# Patient Record
Sex: Male | Born: 1960 | Race: White | Hispanic: No | Marital: Married
Health system: Midwestern US, Community
[De-identification: ages and names within clinical notes are randomized; demographics above are authoritative.]

## PROBLEM LIST (undated history)

## (undated) DIAGNOSIS — M4802 Spinal stenosis, cervical region: Secondary | ICD-10-CM

## (undated) DIAGNOSIS — C4491 Basal cell carcinoma of skin, unspecified: Secondary | ICD-10-CM

## (undated) DIAGNOSIS — M25512 Pain in left shoulder: Secondary | ICD-10-CM

## (undated) DIAGNOSIS — I10 Essential (primary) hypertension: Secondary | ICD-10-CM

## (undated) DIAGNOSIS — M25511 Pain in right shoulder: Secondary | ICD-10-CM

## (undated) HISTORY — DX: Pain in left shoulder: M25.512

## (undated) HISTORY — PX: COLONOSCOPY: SHX174

## (undated) HISTORY — DX: Basal cell carcinoma of skin, unspecified: C44.91

## (undated) HISTORY — PX: BASAL CELL CARCINOMA EXCISION: SHX1214

## (undated) HISTORY — DX: Pain in right shoulder: M25.511

---

## 2006-02-27 ENCOUNTER — Ambulatory Visit: Payer: Self-pay | Admitting: Internal Medicine

## 2007-07-23 ENCOUNTER — Ambulatory Visit: Payer: Self-pay | Admitting: Internal Medicine

## 2007-07-23 LAB — CONVERTED CEMR LAB
ALT: 27 units/L (ref 0–53)
Albumin: 4 g/dL (ref 3.5–5.2)
Alkaline Phosphatase: 93 units/L (ref 39–117)
BUN: 11 mg/dL (ref 6–23)
Basophils Absolute: 0 10*3/uL (ref 0.0–0.1)
Bilirubin, Direct: 0.2 mg/dL (ref 0.0–0.3)
Blood in Urine, dipstick: NEGATIVE
Calcium: 9.5 mg/dL (ref 8.4–10.5)
Eosinophils Absolute: 0.1 10*3/uL (ref 0.0–0.6)
GFR calc Af Amer: 93 mL/min
GFR calc non Af Amer: 77 mL/min
Glucose, Bld: 93 mg/dL (ref 70–99)
Glucose, Urine, Semiquant: NEGATIVE
HDL: 40.7 mg/dL (ref >39.0)
Ketones, urine, test strip: NEGATIVE
Lymphocytes Relative: 32.6 % (ref 12.0–46.0)
MCHC: 33.4 g/dL (ref 30.0–36.0)
MCV: 85.4 fL (ref 78.0–100.0)
Monocytes Relative: 10.1 % (ref 3.0–11.0)
Neutro Abs: 4.8 10*3/uL (ref 1.4–7.7)
Platelets: 266 10*3/uL (ref 150–400)
Protein, U semiquant: NEGATIVE
Triglycerides: 55 mg/dL (ref 0–149)

## 2007-07-30 ENCOUNTER — Ambulatory Visit: Payer: Self-pay | Admitting: Internal Medicine

## 2007-07-31 ENCOUNTER — Encounter: Payer: Self-pay | Admitting: Internal Medicine

## 2009-05-05 ENCOUNTER — Encounter: Payer: Self-pay | Admitting: Internal Medicine

## 2009-06-29 ENCOUNTER — Ambulatory Visit: Payer: Self-pay | Admitting: Internal Medicine

## 2009-06-29 LAB — CONVERTED CEMR LAB
BUN: 10 mg/dL (ref 6–23)
Bilirubin Urine: NEGATIVE
Bilirubin, Direct: 0.2 mg/dL (ref 0.0–0.3)
CO2: 29 meq/L (ref 19–32)
Chloride: 103 meq/L (ref 96–112)
Cholesterol: 111 mg/dL (ref 0–200)
Creatinine, Ser: 1.1 mg/dL (ref 0.4–1.5)
Eosinophils Absolute: 0.1 10*3/uL (ref 0.0–0.7)
Ketones, urine, test strip: NEGATIVE
LDL Cholesterol: 53 mg/dL (ref 0–99)
MCHC: 32.9 g/dL (ref 30.0–36.0)
MCV: 89.1 fL (ref 78.0–100.0)
Monocytes Absolute: 0.7 10*3/uL (ref 0.1–1.0)
Neutrophils Relative %: 55 % (ref 43.0–77.0)
Platelets: 197 10*3/uL (ref 150.0–400.0)
RDW: 12.6 % (ref 11.5–14.6)
Total Bilirubin: 1.9 mg/dL — ABNORMAL HIGH (ref 0.3–1.2)
Triglycerides: 96 mg/dL (ref 0.0–149.0)
Urobilinogen, UA: 0.2
WBC: 7 10*3/uL (ref 4.5–10.5)

## 2009-07-20 ENCOUNTER — Ambulatory Visit: Payer: Self-pay | Admitting: Internal Medicine

## 2010-07-08 NOTE — Assessment & Plan Note (Signed)
Summary: CPX // RS/PT RSC/CJR/PT RSC/CJR/PT [facility] FROM BMP/CJR   Vital Signs:  Patient profile:   50 year old male Height:      71.5 inches Weight:      247 pounds BMI:     34.09 Pulse rate:   60 / minute Resp:     12 per minute BP sitting:   124 / 86  (left arm)  Vitals Entered By: Gladis Riffle, RN (July 20, 2009 8:13 AM) CC: cpx, labs done Is Patient Diabetic? No   CC:  cpx and labs done.  Preventive Screening-Counseling & Management  Alcohol-Tobacco     Alcohol drinks/day: <1     Smoking Status: never  Medications Prior to Update: 1)  No Medications  Allergies (verified): No Known Drug Allergies   Complete Medication List: 1)  No Medications  Physical Exam General Appearance: well developed, well nourished, no acute distress Eyes: conjunctiva and lids normal, PERRL, EOMI Ears, Nose, Mouth, Throat: TM clear, nares clear, oral exam WNL Neck: supple, no lymphadenopathy, no thyromegaly, no JVD Respiratory: clear to auscultation and percussion, respiratory effort normal Cardiovascular: regular rate and rhythm, S1-S2, no murmur, rub or gallop, no bruits, peripheral pulses normal and symmetric, no cyanosis, clubbing, edema or varicosities Chest: no scars, masses, tenderness; no asymmetry, skin changes, nipple discharge, no gynecomastia   Gastrointestinal: soft, non-tender; no hepatosplenomegaly, masses; active bowel sounds all quadrants,  no masses, tenderness, hemorrhoids  Genitourinary: no hernia,  or prostate enlargement Lymphatic: no cervical, axillary or inguinal adenopathy Musculoskeletal: gait normal, muscle tone and strength WNL, no joint swelling, effusions, discoloration, crepitus  Skin: clear, good turgor, color WNL, no rashes, lesions, or ulcerations Neurologic: normal mental status, normal reflexes, normal strength, sensation, and motion Psychiatric: alert; oriented to person, place and time Other Exam:     Appended Document: CPX // RS/PT RSC/CJR/PT  RSC/CJR/PT [facility] FROM BMP/CJR     Allergies: No Known Drug Allergies   Impression & Recommendations:  Problem # 1:  PHYSICAL EXAMINATION (ICD-V70.0) health maint utd discussed need for daily exercise, low calorie diet and aggressive weight loss.   Complete Medication List: 1)  No Medications

## 2010-08-09 ENCOUNTER — Other Ambulatory Visit: Payer: BC Managed Care – PPO | Admitting: Internal Medicine

## 2010-08-09 DIAGNOSIS — Z0000 Encounter for general adult medical examination without abnormal findings: Secondary | ICD-10-CM

## 2010-08-09 LAB — CBC WITH DIFFERENTIAL/PLATELET
Basophils Absolute: 0 10*3/uL (ref 0.0–0.1)
Basophils Relative: 0.4 % (ref 0.0–3.0)
HCT: 45.8 % (ref 39.0–52.0)
Hemoglobin: 15.7 g/dL (ref 13.0–17.0)
Lymphs Abs: 2.7 10*3/uL (ref 0.7–4.0)
MCHC: 34.2 g/dL (ref 30.0–36.0)
Monocytes Relative: 10.9 % (ref 3.0–12.0)
Neutro Abs: 4.5 10*3/uL (ref 1.4–7.7)
RBC: 5.22 Mil/uL (ref 4.22–5.81)
RDW: 13.4 % (ref 11.5–14.6)

## 2010-08-09 LAB — HEPATIC FUNCTION PANEL
Albumin: 3.8 g/dL (ref 3.5–5.2)
Alkaline Phosphatase: 91 U/L (ref 39–117)
Total Protein: 6.7 g/dL (ref 6.0–8.3)

## 2010-08-09 LAB — POCT URINALYSIS DIPSTICK
Ketones, UA: NEGATIVE
Leukocytes, UA: NEGATIVE
Nitrite, UA: NEGATIVE
Protein, UA: NEGATIVE
Urobilinogen, UA: 0.2
pH, UA: 7

## 2010-08-09 LAB — LIPID PANEL
Total CHOL/HDL Ratio: 3
VLDL: 16.4 mg/dL (ref 0.0–40.0)

## 2010-08-09 LAB — BASIC METABOLIC PANEL
CO2: 29 mEq/L (ref 19–32)
GFR: 71.12 mL/min (ref >60.00)
Glucose, Bld: 92 mg/dL (ref 70–99)
Potassium: 4.5 mEq/L (ref 3.5–5.1)
Sodium: 139 mEq/L (ref 135–145)

## 2010-08-09 LAB — TSH: TSH: 2.48 u[IU]/mL (ref 0.35–5.50)

## 2010-08-16 ENCOUNTER — Encounter: Payer: Self-pay | Admitting: Internal Medicine

## 2010-09-03 ENCOUNTER — Encounter: Payer: Self-pay | Admitting: Internal Medicine

## 2010-09-06 ENCOUNTER — Ambulatory Visit (INDEPENDENT_AMBULATORY_CARE_PROVIDER_SITE_OTHER): Payer: BC Managed Care – PPO | Admitting: Internal Medicine

## 2010-09-06 ENCOUNTER — Encounter: Payer: Self-pay | Admitting: Internal Medicine

## 2010-09-06 VITALS — BP 146/82 | HR 76 | Temp 98.3°F | Ht 72.0 in | Wt 256.0 lb

## 2010-09-06 DIAGNOSIS — Z0000 Encounter for general adult medical examination without abnormal findings: Secondary | ICD-10-CM

## 2010-09-06 NOTE — Progress Notes (Signed)
  Subjective:    Patient ID: Bradley Miranda, male    DOB: 1961/01/24, 50 y.o.   MRN: [id number]  HPI cpx  Past Medical History  Diagnosis Date  . Basal cell cancer    Past Surgical History  Procedure Date  . Basal cell carcinoma excision     reports that he has never smoked. He does not have any smokeless tobacco history on file. He reports that he drinks alcohol. He reports that he does not use illicit drugs. family history includes Cancer in his mother. No Known Allergies    Review of Systems  patient denies chest pain, shortness of breath, orthopnea. Denies lower extremity edema, abdominal pain, change in appetite, change in bowel movements. Patient denies rashes, musculoskeletal complaints. No other specific complaints in a complete review of systems.      Objective:   Physical Exam Well-developed male in no acute distress. HEENT exam atraumatic, normocephalic, extraocular muscles are intact. Conjunctivae are pink without exudate. Neck is supple without lymphadenopathy, thyromegaly, jugular venous distention. Chest is clear to auscultation without increased work of breathing. Cardiac exam S1-S2 are regular. The PMI is normal. No significant murmurs or gallops. Abdominal exam active bowel sounds, soft, nontender. No abdominal bruits. Extremities no clubbing cyanosis or edema. Peripheral pulses are normal without bruits. Neurologic exam alert and oriented without any motor or sensory deficits.      Assessment & Plan:   Well visit: Health maint UTD Discussed need for regualr exercise and aggressive weight loss

## 2011-09-12 ENCOUNTER — Encounter: Payer: Self-pay | Admitting: Internal Medicine

## 2011-09-12 ENCOUNTER — Ambulatory Visit (INDEPENDENT_AMBULATORY_CARE_PROVIDER_SITE_OTHER): Payer: BC Managed Care – PPO | Admitting: Internal Medicine

## 2011-09-12 VITALS — BP 134/84 | HR 80 | Temp 98.4°F | Ht 72.0 in | Wt 252.0 lb

## 2011-09-12 DIAGNOSIS — Z0000 Encounter for general adult medical examination without abnormal findings: Secondary | ICD-10-CM

## 2011-09-12 DIAGNOSIS — E669 Obesity, unspecified: Secondary | ICD-10-CM

## 2011-09-12 LAB — CBC WITH DIFFERENTIAL/PLATELET
Basophils Absolute: 0 10*3/uL (ref 0.0–0.1)
HCT: 41.3 % (ref 39.0–52.0)
Lymphocytes Relative: 29.5 % (ref 12.0–46.0)
Lymphs Abs: 2.1 10*3/uL (ref 0.7–4.0)
Monocytes Relative: 10 % (ref 3.0–12.0)
Platelets: 209 10*3/uL (ref 150.0–400.0)
RDW: 13.9 % (ref 11.5–14.6)

## 2011-09-12 LAB — POCT URINALYSIS DIPSTICK
Protein, UA: NEGATIVE
Spec Grav, UA: 1.02
Urobilinogen, UA: 0.2
pH, UA: 7

## 2011-09-12 LAB — BASIC METABOLIC PANEL
CO2: 28 mEq/L (ref 19–32)
Chloride: 105 mEq/L (ref 96–112)
Glucose, Bld: 105 mg/dL — ABNORMAL HIGH (ref 70–99)
Potassium: 3.9 mEq/L (ref 3.5–5.1)
Sodium: 142 mEq/L (ref 135–145)

## 2011-09-12 LAB — HEPATIC FUNCTION PANEL
AST: 28 U/L (ref 0–37)
Albumin: 4.1 g/dL (ref 3.5–5.2)
Alkaline Phosphatase: 101 U/L (ref 39–117)
Total Protein: 6.7 g/dL (ref 6.0–8.3)

## 2011-09-12 LAB — TSH: TSH: 3.13 u[IU]/mL (ref 0.35–5.50)

## 2011-09-12 NOTE — Progress Notes (Signed)
Subjective:     Patient ID: Bradley Miranda, male   DOB: 1961/01/28, 51 y.o.   MRN: [id number]  HPI cpx  Past Medical History  Diagnosis Date  . Basal cell cancer    Past Surgical History  Procedure Date  . Basal cell carcinoma excision     reports that he has never smoked. He does not have any smokeless tobacco history on file. He reports that he drinks alcohol. He reports that he does not use illicit drugs. family history includes Cancer in his mother. No Known Allergies   Review of Systems  patient denies chest pain, shortness of breath, orthopnea. Denies lower extremity edema, abdominal pain, change in appetite, change in bowel movements. Patient denies rashes, musculoskeletal complaints. No other specific complaints in a complete review of systems.      Objective:   Physical Exam Well-developed male in no acute distress. HEENT exam atraumatic, normocephalic, extraocular muscles are intact. Conjunctivae are pink without exudate. Neck is supple without lymphadenopathy, thyromegaly, jugular venous distention. Chest is clear to auscultation without increased work of breathing. Cardiac exam S1-S2 are regular. The PMI is normal. No significant murmurs or gallops. Abdominal exam active bowel sounds, soft, nontender. No abdominal bruits. Extremities no clubbing cyanosis or edema. Peripheral pulses are normal without bruits. Neurologic exam alert and oriented without any motor or sensory deficits. Rectal exam normal tone prostate normal size without masses or asymmetry.     Assessment:     Well Visit    Plan:     Health maint utd Schedule colonoscopy  Weight loss discussed

## 2011-11-01 ENCOUNTER — Encounter: Payer: Self-pay | Admitting: Gastroenterology

## 2011-12-13 ENCOUNTER — Encounter: Payer: Self-pay | Admitting: Gastroenterology

## 2011-12-13 ENCOUNTER — Ambulatory Visit (AMBULATORY_SURGERY_CENTER): Payer: BC Managed Care – PPO

## 2011-12-13 VITALS — Ht 73.0 in | Wt 249.0 lb

## 2011-12-13 DIAGNOSIS — Z800 Family history of malignant neoplasm of digestive organs: Secondary | ICD-10-CM

## 2011-12-13 DIAGNOSIS — Z1211 Encounter for screening for malignant neoplasm of colon: Secondary | ICD-10-CM

## 2011-12-13 MED ORDER — MOVIPREP 100 G PO SOLR: ORAL | Status: DC

## 2011-12-26 ENCOUNTER — Ambulatory Visit (AMBULATORY_SURGERY_CENTER): Payer: BC Managed Care – PPO | Admitting: Gastroenterology

## 2011-12-26 ENCOUNTER — Encounter: Payer: Self-pay | Admitting: Gastroenterology

## 2011-12-26 VITALS — BP 131/80 | HR 71 | Temp 97.5°F | Resp 12 | Ht 73.0 in | Wt 249.0 lb

## 2011-12-26 DIAGNOSIS — Z1211 Encounter for screening for malignant neoplasm of colon: Secondary | ICD-10-CM

## 2011-12-26 MED ORDER — SODIUM CHLORIDE 0.9 % IV SOLN: 500.0000 mL | INTRAVENOUS | Status: DC

## 2011-12-26 NOTE — Progress Notes (Signed)
Patient did not experience any of the following events: a burn prior to discharge; a fall within the facility; wrong site/side/patient/procedure/implant event; or a hospital transfer or hospital admission upon discharge from the facility. (G8907) Patient did not have preoperative order for IV antibiotic SSI prophylaxis. (G8918)  

## 2011-12-26 NOTE — Patient Instructions (Addendum)
Discharge instructions given with verbal understanding. Normal exam. Resume previous medications. YOU HAD AN ENDOSCOPIC PROCEDURE TODAY AT THE [facility]: Refer to the procedure report that was given to you for any specific questions about what was found during the examination.  If the procedure report does not answer your questions, please call your gastroenterologist to clarify.  If you requested that your care partner not be given the details of your procedure findings, then the procedure report has been included in a sealed envelope for you to review at your convenience later.  YOU SHOULD EXPECT: Some feelings of bloating in the abdomen. Passage of more gas than usual.  Walking can help get rid of the air that was put into your GI tract during the procedure and reduce the bloating. If you had a lower endoscopy (such as a colonoscopy or flexible sigmoidoscopy) you may notice spotting of blood in your stool or on the toilet paper. If you underwent a bowel prep for your procedure, then you may not have a normal bowel movement for a few days.  DIET: Your first meal following the procedure should be a light meal and then it is ok to progress to your normal diet.  A half-sandwich or bowl of soup is an example of a good first meal.  Heavy or fried foods are harder to digest and may make you feel nauseous or bloated.  Likewise meals heavy in dairy and vegetables can cause extra gas to form and this can also increase the bloating.  Drink plenty of fluids but you should avoid alcoholic beverages for 24 hours.  ACTIVITY: Your care partner should take you home directly after the procedure.  You should plan to take it easy, moving slowly for the rest of the day.  You can resume normal activity the day after the procedure however you should NOT DRIVE or use heavy machinery for 24 hours (because of the sedation medicines used during the test).    SYMPTOMS TO REPORT IMMEDIATELY: A gastroenterologist  can be reached at any hour.  During normal business hours, 8:30 AM to 5:00 PM Monday through Friday, call [phone].  After hours and on weekends, please call the GI answering service at [phone] who will take a message and have the physician on call contact you.   Following lower endoscopy (colonoscopy or flexible sigmoidoscopy):  Excessive amounts of blood in the stool  Significant tenderness or worsening of abdominal pains  Swelling of the abdomen that is new, acute  Fever of 100F or higher  FOLLOW UP: If any biopsies were taken you will be contacted by phone or by letter within the next 1-3 weeks.  Call your gastroenterologist if you have not heard about the biopsies in 3 weeks.  Our staff will call the home number listed on your records the next business day following your procedure to check on you and address any questions or concerns that you may have at that time regarding the information given to you following your procedure. This is a courtesy call and so if there is no answer at the home number and we have not heard from you through the emergency physician on call, we will assume that you have returned to your regular daily activities without incident.  SIGNATURES/CONFIDENTIALITY: You and/or your care partner have signed paperwork which will be entered into your electronic medical record.  These signatures attest to the fact that that the information above on your After Visit Summary has been reviewed   and is understood.  Full responsibility of the confidentiality of this discharge information lies with you and/or your care-partner. 

## 2011-12-26 NOTE — Op Note (Signed)
[facility] [address] [place], [place]  [address]  COLONOSCOPY PROCEDURE REPORT  PATIENT:  Bradley, Miranda  MR#:  [id number] BIRTHDATE:  [birth year], 51 yrs. old  GENDER:  male ENDOSCOPIST:  Vania Rea. Jarold Motto, MD, [facility] REF. BY:  Birdie Sons, M.D. PROCEDURE DATE:  12/26/2011 PROCEDURE:  Average-risk screening colonoscopy G0121 ASA CLASS:  Class II INDICATIONS:  Routine Risk Screening MEDICATIONS:   propofol (Diprivan) 250 mg IV  DESCRIPTION OF PROCEDURE:   After the risks and benefits and of the procedure were explained, informed consent was obtained. Digital rectal exam was performed and revealed no abnormalities. The LB [id number] endoscope was introduced through the anus and advanced to the cecum, which was identified by both the appendix and ileocecal valve.  The quality of the prep was excellent, using MoviPrep.  The instrument was then slowly withdrawn as the colon was fully examined. <<PROCEDUREIMAGES>>  FINDINGS:  No polyps or cancers were seen.  This was otherwise a normal examination of the colon.   Retroflexed views in the rectum revealed no abnormalities.    The scope was then withdrawn from the patient and the procedure completed.  COMPLICATIONS:  None ENDOSCOPIC IMPRESSION: 1) No polyps or cancers 2) Otherwise normal examination RECOMMENDATIONS: 1) Continue current colorectal screening recommendations for "routine risk" patients with a repeat colonoscopy in 10 years.  REPEAT EXAM:  No  ______________________________ Vania Rea. Jarold Motto, MD, [facility]  CC:  n. eSIGNED:   Vania Rea. Verneal Wiers at 12/26/2011 10:13 AM  Marcello Moores, [id number]

## 2011-12-27 ENCOUNTER — Telehealth: Payer: Self-pay

## 2011-12-27 NOTE — Telephone Encounter (Signed)
  Follow up Call-  Call back number 12/26/2011  Post procedure Call Back phone  # [phone]  Permission to leave phone message Yes     Patient questions:  Do you have a fever, pain , or abdominal swelling? no Pain Score  0 *  Have you tolerated food without any problems? yes  Have you been able to return to your normal activities? yes  Do you have any questions about your discharge instructions: Diet   no Medications  no Follow up visit  no  Do you have questions or concerns about your Care? no  Actions: * If pain score is 4 or above: No action needed, pain <4.

## 2012-10-15 ENCOUNTER — Ambulatory Visit (INDEPENDENT_AMBULATORY_CARE_PROVIDER_SITE_OTHER): Payer: BC Managed Care – PPO | Admitting: Internal Medicine

## 2012-10-15 ENCOUNTER — Encounter: Payer: Self-pay | Admitting: Internal Medicine

## 2012-10-15 VITALS — BP 108/78 | HR 68 | Temp 98.0°F | Ht 72.0 in | Wt 255.0 lb

## 2012-10-15 DIAGNOSIS — Z0000 Encounter for general adult medical examination without abnormal findings: Secondary | ICD-10-CM

## 2012-10-15 LAB — BASIC METABOLIC PANEL
BUN: 17 mg/dL (ref 6–23)
Chloride: 104 mEq/L (ref 96–112)
Creatinine, Ser: 1.2 mg/dL (ref 0.4–1.5)
Glucose, Bld: 99 mg/dL (ref 70–99)
Potassium: 3.8 mEq/L (ref 3.5–5.1)

## 2012-10-15 LAB — CBC WITH DIFFERENTIAL/PLATELET
Basophils Absolute: 0 10*3/uL (ref 0.0–0.1)
Eosinophils Relative: 1.6 % (ref 0.0–5.0)
Lymphocytes Relative: 31 % (ref 12.0–46.0)
Monocytes Relative: 11.1 % (ref 3.0–12.0)
Neutrophils Relative %: 56.1 % (ref 43.0–77.0)
Platelets: 188 10*3/uL (ref 150.0–400.0)
RDW: 13.9 % (ref 11.5–14.6)
WBC: 8.2 10*3/uL (ref 4.5–10.5)

## 2012-10-15 LAB — HEPATIC FUNCTION PANEL
ALT: 30 U/L (ref 0–53)
AST: 27 U/L (ref 0–37)
Albumin: 3.7 g/dL (ref 3.5–5.2)

## 2012-10-15 LAB — LIPID PANEL
Cholesterol: 150 mg/dL (ref 0–200)
HDL: 50.2 mg/dL (ref >39.00)
Triglycerides: 89 mg/dL (ref 0.0–149.0)
VLDL: 17.8 mg/dL (ref 0.0–40.0)

## 2012-10-15 LAB — TSH: TSH: 2.65 u[IU]/mL (ref 0.35–5.50)

## 2012-10-15 NOTE — Progress Notes (Signed)
Patient ID: Bradley Miranda, male   DOB: 05-17-1961, 52 y.o.   MRN: [id number]  CPX Has chronic sacral pain-- sees murphy/wainer. Had trial of prednisone and vicodin The pain is MUCH BETTER.   Past Medical History  Diagnosis Date  . Basal cell cancer     right arm    History   Social History  . Marital Status: Married    Spouse Name: N/A    Number of Children: N/A  . Years of Education: N/A   Occupational History  . Not on file.   Social History Main Topics  . Smoking status: Never Smoker   . Smokeless tobacco: Never Used  . Alcohol Use: No     Comment: occasional  . Drug Use: No  . Sexually Active: Not on file   Other Topics Concern  . Not on file   Social History Narrative  . No narrative on file    Past Surgical History  Procedure Laterality Date  . Basal cell carcinoma excision      Family History  Problem Relation Age of Onset  . Cancer Mother     breast  . Stomach cancer Father   . Colon cancer Neg Hx   . Esophageal cancer Neg Hx   . Rectal cancer Neg Hx     No Known Allergies  Current Outpatient Prescriptions on File Prior to Visit  Medication Sig Dispense Refill  . Multiple Vitamin (MULTIVITAMIN) tablet Take 1 tablet by mouth daily.      Marland Kitchen OVER THE COUNTER MEDICATION Sinus nasal spray -prn       No current facility-administered medications on file prior to visit.     patient denies chest pain, shortness of breath, orthopnea. Denies lower extremity edema, abdominal pain, change in appetite, change in bowel movements. Patient denies rashes, musculoskeletal complaints. No other specific complaints in a complete review of systems.   BP 108/78  Pulse 68  Temp(Src) 98 F (36.7 C) (Oral)  Ht 6' (1.829 m)  Wt 255 lb (115.667 kg)  BMI 34.58 kg/m2   well-developed well-nourished male in no acute distress. HEENT exam atraumatic, normocephalic, neck supple without jugular venous distention. Chest clear to auscultation cardiac exam S1-S2 are  regular. Abdominal exam overweight with bowel sounds, soft and nontender. Extremities no edema. Neurologic exam is alert with a normal gait.   A/p- well visit- health maint utd  Weight-- discussed at length. Needs to exercise daily. Low calorie diet.

## 2014-05-31 ENCOUNTER — Emergency Department (HOSPITAL_COMMUNITY): Payer: BC Managed Care – PPO

## 2014-05-31 ENCOUNTER — Emergency Department (HOSPITAL_COMMUNITY)
Admission: EM | Admit: 2014-05-31 | Discharge: 2014-05-31 | Disposition: A | Discharge status: Home / Self Care | Payer: BC Managed Care – PPO | Attending: Emergency Medicine | Admitting: Emergency Medicine

## 2014-05-31 ENCOUNTER — Encounter (HOSPITAL_COMMUNITY): Payer: Self-pay | Admitting: *Deleted

## 2014-05-31 DIAGNOSIS — Z85828 Personal history of other malignant neoplasm of skin: Secondary | ICD-10-CM | POA: Insufficient documentation

## 2014-05-31 DIAGNOSIS — R079 Chest pain, unspecified: Secondary | ICD-10-CM | POA: Insufficient documentation

## 2014-05-31 LAB — CBC
HCT: 42.1 % (ref 39.0–52.0)
HEMOGLOBIN: 13.8 g/dL (ref 13.0–17.0)
MCH: 26.5 pg (ref 26.0–34.0)
MCHC: 32.8 g/dL (ref 30.0–36.0)
MCV: 80.8 fL (ref 78.0–100.0)
PLATELETS: 224 10*3/uL (ref 150–400)
RBC: 5.21 MIL/uL (ref 4.22–5.81)
RDW: 13.2 % (ref 11.5–15.5)
WBC: 9.4 10*3/uL (ref 4.0–10.5)

## 2014-05-31 LAB — BASIC METABOLIC PANEL
ANION GAP: 8 (ref 5–15)
BUN: 14 mg/dL (ref 6–23)
CALCIUM: 9.1 mg/dL (ref 8.4–10.5)
CHLORIDE: 107 meq/L (ref 96–112)
CO2: 25 mmol/L (ref 19–32)
CREATININE: 1.25 mg/dL (ref 0.50–1.35)
GFR calc Af Amer: 75 mL/min — ABNORMAL LOW (ref >90)
GFR calc non Af Amer: 65 mL/min — ABNORMAL LOW (ref >90)
GLUCOSE: 130 mg/dL — AB (ref 70–99)
Potassium: 3.4 mmol/L — ABNORMAL LOW (ref 3.5–5.1)
Sodium: 140 mmol/L (ref 135–145)

## 2014-05-31 LAB — I-STAT TROPONIN, ED
TROPONIN I, POC: 0 ng/mL (ref 0.00–0.08)
Troponin i, poc: 0.01 ng/mL (ref 0.00–0.08)

## 2014-05-31 MED ORDER — HYDROCODONE-ACETAMINOPHEN 5-325 MG PO TABS
1.0000 | ORAL_TABLET | ORAL | Status: AC
  Administered 2014-05-31: 1 via ORAL
  Filled 2014-05-31: qty 1

## 2014-05-31 MED ORDER — HYDROCODONE-ACETAMINOPHEN 5-325 MG PO TABS
1.0000 | ORAL_TABLET | ORAL | Status: DC | PRN
Start: 2014-05-31 — End: 2015-03-23

## 2014-05-31 NOTE — Discharge Instructions (Signed)

## 2014-05-31 NOTE — ED Notes (Signed)
Pt reports having pain to left arm and shoulder for weeks, it was more severe to upper arm today so he took naproxen. Then began having left side chest pains that are intermittent and into left arm. Also had random pain to left knee, pt thinks its a reaction to the naproxen. ekg done at triage, airway intact.

## 2014-05-31 NOTE — ED Provider Notes (Signed)
CSN: [id number]     Arrival date & time 05/31/14  1747 History   First MD Initiated Contact with Patient 05/31/14 2211     Chief Complaint  Patient presents with  . Chest Pain   HPI Pt had been having some pain in his arm the other day but he thought that was related to tendonitis.  His elbow was sore to the touch and it hurt to move.  Today he started having pain in the left chest going to his arm.  The pain comes and goes.  Last 15 to 20 minutes at a time.    Radiates to the inner aspect of the arm.  Movement and being active makes it better.  Sitting down makes it worse.  No shortness of breath or nausea.  No fevers, cough or swelling.   No HTN, DM.  No family history of heart disease.    Pt denies any pain in his chest with exertion or activity. Past Medical History  Diagnosis Date  . Basal cell cancer     right arm   Past Surgical History  Procedure Laterality Date  . Basal cell carcinoma excision     Family History  Problem Relation Age of Onset  . Cancer Mother     breast  . Stomach cancer Father   . Colon cancer Neg Hx   . Esophageal cancer Neg Hx   . Rectal cancer Neg Hx    History  Substance Use Topics  . Smoking status: Never Smoker   . Smokeless tobacco: Never Used  . Alcohol Use: No     Comment: occasional    Review of Systems  All other systems reviewed and are negative.     Allergies  Review of patient's allergies indicates no known allergies.  Home Medications   Prior to Admission medications   Medication Sig Start Date End Date Taking? Authorizing Provider  naproxen (NAPROSYN) 250 MG tablet Take 250 mg by mouth daily as needed for mild pain.   Yes Historical Provider, MD  HYDROcodone-acetaminophen (NORCO/VICODIN) 5-325 MG per tablet Take 1-2 tablets by mouth every 4 (four) hours as needed. 05/31/14   Dorie Rank, MD   BP 142/79 mmHg  Pulse 73  Temp(Src) 97.5 F (36.4 C) (Oral)  Resp 16  Ht 6' (1.829 m)  Wt 263 lb 9 oz (119.551 kg)  BMI 35.74  kg/m2  SpO2 97% Physical Exam  Constitutional: He appears well-developed and well-nourished. No distress.  HENT:  Head: Normocephalic and atraumatic.  Right Ear: External ear normal.  Left Ear: External ear normal.  Eyes: Conjunctivae are normal. Right eye exhibits no discharge. Left eye exhibits no discharge. No scleral icterus.  Neck: Neck supple. No tracheal deviation present.  Cardiovascular: Normal rate, regular rhythm and intact distal pulses.   Pulmonary/Chest: Effort normal and breath sounds normal. No stridor. No respiratory distress. He has no wheezes. He has no rales.  Abdominal: Soft. Bowel sounds are normal. He exhibits no distension. There is no tenderness. There is no rebound and no guarding.  Musculoskeletal: He exhibits no edema or tenderness.  Neurological: He is alert. He has normal strength. No cranial nerve deficit (no facial droop, extraocular movements intact, no slurred speech) or sensory deficit. He exhibits normal muscle tone. He displays no seizure activity. Coordination normal.  Skin: Skin is warm and dry. No rash noted.  Psychiatric: He has a normal mood and affect.  Nursing note and vitals reviewed.   ED Course  Procedures (including critical  care time) Labs Review Labs Reviewed  BASIC METABOLIC PANEL - Abnormal; Notable for the following:    Potassium 3.4 (*)    Glucose, Bld 130 (*)    GFR calc non Af Amer 65 (*)    GFR calc Af Amer 75 (*)    All other components within normal limits  CBC  I-STAT TROPOININ, ED  Randolm Idol, ED    Imaging Review Dg Chest 2 View  05/31/2014   CLINICAL DATA:  53 year old male with left chest pain radiating to the left upper extremity for 2 days. Initial encounter.  EXAM: CHEST  2 VIEW  COMPARISON:  None.  FINDINGS: Mildly low lung volumes. Normal cardiac size and mediastinal contours. Visualized tracheal air column is within normal limits. No pneumothorax, pulmonary edema, pleural effusion or confluent pulmonary  opacity. No acute osseous abnormality identified.  IMPRESSION: No acute cardiopulmonary abnormality.   Electronically Signed   By: Lars Pinks M.D.   On: 05/31/2014 20:58     EKG Interpretation   Date/Time:  Saturday May 31 2014 18:03:07 EST Ventricular Rate:  74 PR Interval:  152 QRS Duration: 112 QT Interval:  392 QTC Calculation: 435 R Axis:   106 Text Interpretation:  ** Suspect arm lead reversal, interpretation  assumes no reversal Normal sinus rhythm Rightward axis Borderline ECG No  old tracing to compare Confirmed by Darcell Yacoub  MD-J, Keilan Nichol ([id number]) on 05/31/2014  10:19:12 PM      MDM   Final diagnoses:  Chest pain, unspecified chest pain type    Low risk for ACS.  Heart score 1.  2 sets of cardiac enzymes normal.  Sx could be related to muscular etiology, nerve impingement.  Will dc home.  Follow up with pcp for outpatient stress test.    Dorie Rank, MD 05/31/14 2340

## 2014-10-17 ENCOUNTER — Ambulatory Visit: Payer: BC Managed Care – PPO | Admitting: Family Medicine

## 2015-03-22 ENCOUNTER — Emergency Department (HOSPITAL_COMMUNITY)
Admission: EM | Admit: 2015-03-22 | Discharge: 2015-03-23 | Disposition: A | Discharge status: Home / Self Care | Payer: BLUE CROSS/BLUE SHIELD | Attending: Emergency Medicine | Admitting: Emergency Medicine

## 2015-03-22 ENCOUNTER — Emergency Department (HOSPITAL_COMMUNITY): Payer: BLUE CROSS/BLUE SHIELD

## 2015-03-22 ENCOUNTER — Encounter (HOSPITAL_COMMUNITY): Payer: Self-pay

## 2015-03-22 DIAGNOSIS — R079 Chest pain, unspecified: Secondary | ICD-10-CM | POA: Diagnosis not present

## 2015-03-22 DIAGNOSIS — Z85828 Personal history of other malignant neoplasm of skin: Secondary | ICD-10-CM | POA: Diagnosis not present

## 2015-03-22 DIAGNOSIS — R42 Dizziness and giddiness: Secondary | ICD-10-CM | POA: Diagnosis not present

## 2015-03-22 LAB — CBC
HEMATOCRIT: 45.6 % (ref 39.0–52.0)
Hemoglobin: 15.7 g/dL (ref 13.0–17.0)
MCH: 29.3 pg (ref 26.0–34.0)
MCHC: 34.4 g/dL (ref 30.0–36.0)
MCV: 85.2 fL (ref 78.0–100.0)
Platelets: 157 10*3/uL (ref 150–400)
RBC: 5.35 MIL/uL (ref 4.22–5.81)
RDW: 13 % (ref 11.5–15.5)
WBC: 16.8 10*3/uL — ABNORMAL HIGH (ref 4.0–10.5)

## 2015-03-22 LAB — BASIC METABOLIC PANEL
Anion gap: 10 (ref 5–15)
BUN: 14 mg/dL (ref 6–20)
CO2: 23 mmol/L (ref 22–32)
Calcium: 9.1 mg/dL (ref 8.9–10.3)
Chloride: 102 mmol/L (ref 101–111)
Creatinine, Ser: 1.16 mg/dL (ref 0.61–1.24)
GFR calc Af Amer: >60 mL/min (ref >60)
GFR calc non Af Amer: >60 mL/min (ref >60)
GLUCOSE: 125 mg/dL — AB (ref 65–99)
POTASSIUM: 3.9 mmol/L (ref 3.5–5.1)
Sodium: 135 mmol/L (ref 135–145)

## 2015-03-22 LAB — I-STAT TROPONIN, ED: Troponin i, poc: 0 ng/mL (ref 0.00–0.08)

## 2015-03-22 MED ORDER — NITROGLYCERIN 0.4 MG SL SUBL: 0.4000 mg | SUBLINGUAL_TABLET | SUBLINGUAL | Status: DC | PRN

## 2015-03-22 NOTE — ED Notes (Addendum)
Pt reports central dull chest pressure/pain that radiates to both left and right sides of his chest. Onset tonight around 1800. He went to his PCP on Friday for chest pain, EKG was done and was told everything was ok at that point. However, the pain has come back and worsened so he decided to come here to be seen. Pt endorses occasional SOB and lightheadedness.

## 2015-03-22 NOTE — ED Provider Notes (Signed)
CSN: [id number]   Arrival date & time 03/22/15 2150  History  By signing my name below, I, Altamease Oiler, attest that this documentation has been prepared under the direction and in the presence of Ripley Fraise, MD. Electronically Signed: Altamease Oiler, ED Scribe. 03/22/2015. 11:55 PM.  Chief Complaint  Patient presents with  . Chest Pain    HPI Patient is a 54 y.o. male presenting with chest pain. The history is provided by the patient. No language interpreter was used.  Chest Pain Chest pain location: Central chest. Pain radiates to:  Does not radiate Pain radiates to the back: no   Pain severity:  Moderate Onset quality:  Sudden Duration:  6 hours Timing:  Constant Progression:  Unchanged Chronicity:  New Context: not breathing, no movement and no trauma   Relieved by: Walking. Worsened by:  Nothing tried Ineffective treatments:  Aspirin Associated symptoms: dizziness   Associated symptoms: no abdominal pain, no altered mental status, no anorexia, no anxiety, no back pain, no cough, no diaphoresis, no fever, no nausea, no numbness, no shortness of breath, no syncope, not vomiting and no weakness    Bradley Miranda is a 54 y.o. male who presents to the Emergency Department complaining of constant central chest pain with onset 5-6 hours ago after yard work. He describes the pain as pressure that "shifts from side-to-side" and rates it 6/10 in severity. Walking and applying pressure improve the pain. Pt was seen at [facility] for left-sided chest pain 2 days ago where he reportedly had a normal EKG. Associated symptoms include light headedness. He is currently being treated for left shoulder pain with Prednisone and notes taking 6 tablets prior to the onset of pain. He also took 2 Bayer aspirin for pain control. Pt denies fever, sweating, vomiting, cough, SOB, abdominal pain, extremity weakness. No history of DM, HTN, DVT/PE high cholesterol, or tobacco use. No family  history of cardiac disease.    Past Medical History  Diagnosis Date  . Basal cell cancer     right arm    Past Surgical History  Procedure Laterality Date  . Basal cell carcinoma excision      Family History  Problem Relation Age of Onset  . Cancer Mother     breast  . Stomach cancer Father   . Colon cancer Neg Hx   . Esophageal cancer Neg Hx   . Rectal cancer Neg Hx     Social History  Substance Use Topics  . Smoking status: Never Smoker   . Smokeless tobacco: Never Used  . Alcohol Use: No     Comment: occasional     Review of Systems  Constitutional: Negative for fever and diaphoresis.  Respiratory: Negative for cough and shortness of breath.   Cardiovascular: Positive for chest pain. Negative for syncope.  Gastrointestinal: Negative for nausea, vomiting, abdominal pain and anorexia.  Musculoskeletal: Negative for back pain.  Neurological: Positive for dizziness. Negative for weakness and numbness.  All other systems reviewed and are negative.  Home Medications   Prior to Admission medications   Medication Sig Start Date End Date Taking? Authorizing Provider  HYDROcodone-acetaminophen (NORCO/VICODIN) 5-325 MG per tablet Take 1-2 tablets by mouth every 4 (four) hours as needed. 05/31/14   Dorie Rank, MD  naproxen (NAPROSYN) 250 MG tablet Take 250 mg by mouth daily as needed for mild pain.    Historical Provider, MD    Allergies  Review of patient's allergies indicates no known allergies.  Triage Vitals:  BP 165/87 mmHg  Pulse 83  Temp(Src) 98 F (36.7 C) (Oral)  Resp 18  Ht 6' (1.829 m)  Wt 260 lb (117.935 kg)  BMI 35.25 kg/m2  SpO2 95%  Physical Exam CONSTITUTIONAL: Well developed/well nourished HEAD: Normocephalic/atraumatic EYES: EOMI/PERRL ENMT: Mucous membranes moist NECK: supple no meningeal signs SPINE/BACK:entire spine nontender CV: S1/S2 noted, no murmurs/rubs/gallops noted LUNGS: Lungs are clear to auscultation bilaterally, no apparent  distress ABDOMEN: soft, nontender, no rebound or guarding, bowel sounds noted throughout abdomen GU:no cva tenderness NEURO: Pt is awake/alert/appropriate, moves all extremitiesx4.  No facial droop.   EXTREMITIES: pulses normal/equal, full ROM, no calf tenderness noted SKIN: warm, color normal PSYCH: no abnormalities of mood noted, alert and oriented to situation  ED Course  Procedures  Medications  oxyCODONE-acetaminophen (PERCOCET/ROXICET) 5-325 MG per tablet 1 tablet (1 tablet Oral Given 03/23/15 0117)    DIAGNOSTIC STUDIES: Oxygen Saturation is 95% on RA, normal by my interpretation.    COORDINATION OF CARE: 11:54 PM Discussed treatment plan which includes CXR, lab work, and EKG with pt at bedside and pt agreed to plan.  12:20 AM HEART score less than 3 Pt is well appearing Will check repeat troponin If negative will discharge home  Repeat troponin negative Pt without any worsening CP Will d/c home We discussed need for followup We discussed return precautions BP 141/83 mmHg  Pulse 64  Temp(Src) 97.7 F (36.5 C) (Oral)  Resp 18  Ht 6' (1.829 m)  Wt 260 lb (117.935 kg)  BMI 35.25 kg/m2  SpO2 100%   Labs Reviewed  BASIC METABOLIC PANEL - Abnormal; Notable for the following:    Glucose, Bld 125 (*)    All other components within normal limits  CBC - Abnormal; Notable for the following:    WBC 16.8 (*)    All other components within normal limits  I-STAT TROPOININ, ED  I-STAT TROPOININ, ED  I-STAT TROPOININ, ED    Imaging Review Dg Chest 2 View  03/22/2015  CLINICAL DATA:  Acute onset of central chest pain this evening, radiating into the left and right chest. EXAM: CHEST  2 VIEW COMPARISON:  05/31/2014 FINDINGS: The cardiomediastinal contours are normal. The lungs are clear. Pulmonary vasculature is normal. No consolidation, pleural effusion, or pneumothorax. No acute osseous abnormalities are seen. IMPRESSION: No acute pulmonary process. Electronically  Signed   By: Jeb Levering M.D.   On: 03/22/2015 23:53    I personally reviewed and evaluated these images and lab results as a part of my medical decision-making.   EKG Interpretation  Date/Time:  Sunday March 22 2015 21:55:15 EDT Ventricular Rate:  81 PR Interval:  144 QRS Duration: 110 QT Interval:  384 QTC Calculation: 446 R Axis:   43 Text Interpretation:  Normal sinus rhythm Normal ECG No significant change since last tracing Confirmed by Christy Gentles  MD, Elenore Rota ([id number]) on 03/22/2015 11:43:53 PM    MDM   Final diagnoses:  Chest pain, unspecified chest pain type     Nursing notes including past medical history and social history reviewed and considered in documentation xrays/imaging reviewed by myself and considered during evaluation Labs/vital reviewed myself and considered during evaluation   I, Sharyon Cable, personally performed the services described in this documentation. All medical record entries made by the scribe were at my direction and in my presence.  I have reviewed the chart and discharge instructions and agree that the record reflects my personal performance and is accurate and complete. Sharyon Cable.  03/23/2015.  12:20 AM.     Ripley Fraise, MD 03/23/15 [phone]

## 2015-03-23 LAB — I-STAT TROPONIN, ED: TROPONIN I, POC: 0.01 ng/mL (ref 0.00–0.08)

## 2015-03-23 MED ORDER — OXYCODONE-ACETAMINOPHEN 5-325 MG PO TABS
1.0000 | ORAL_TABLET | Freq: Once | ORAL | Status: AC
  Administered 2015-03-23: 1 via ORAL
  Filled 2015-03-23: qty 1

## 2015-03-23 NOTE — Discharge Instructions (Signed)

## 2015-12-21 ENCOUNTER — Emergency Department (HOSPITAL_COMMUNITY)
Admission: EM | Admit: 2015-12-21 | Discharge: 2015-12-21 | Disposition: A | Discharge status: Home / Self Care | Payer: BLUE CROSS/BLUE SHIELD | Attending: Emergency Medicine | Admitting: Emergency Medicine

## 2015-12-21 ENCOUNTER — Emergency Department (HOSPITAL_COMMUNITY): Payer: BLUE CROSS/BLUE SHIELD

## 2015-12-21 ENCOUNTER — Other Ambulatory Visit: Payer: Self-pay

## 2015-12-21 ENCOUNTER — Encounter (HOSPITAL_COMMUNITY): Payer: Self-pay | Admitting: Emergency Medicine

## 2015-12-21 DIAGNOSIS — R0789 Other chest pain: Secondary | ICD-10-CM | POA: Diagnosis not present

## 2015-12-21 DIAGNOSIS — R079 Chest pain, unspecified: Secondary | ICD-10-CM | POA: Insufficient documentation

## 2015-12-21 DIAGNOSIS — R0602 Shortness of breath: Secondary | ICD-10-CM | POA: Diagnosis not present

## 2015-12-21 LAB — I-STAT TROPONIN, ED
Troponin i, poc: 0 ng/mL (ref 0.00–0.08)
Troponin i, poc: 0 ng/mL (ref 0.00–0.08)

## 2015-12-21 LAB — BASIC METABOLIC PANEL
Anion gap: 6 (ref 5–15)
BUN: 11 mg/dL (ref 6–20)
CHLORIDE: 107 mmol/L (ref 101–111)
CO2: 24 mmol/L (ref 22–32)
Calcium: 8.8 mg/dL — ABNORMAL LOW (ref 8.9–10.3)
Creatinine, Ser: 1 mg/dL (ref 0.61–1.24)
GFR calc Af Amer: >60 mL/min (ref >60)
GFR calc non Af Amer: >60 mL/min (ref >60)
GLUCOSE: 128 mg/dL — AB (ref 65–99)
POTASSIUM: 3.6 mmol/L (ref 3.5–5.1)
Sodium: 137 mmol/L (ref 135–145)

## 2015-12-21 LAB — CBC
HEMATOCRIT: 48.1 % (ref 39.0–52.0)
Hemoglobin: 16.7 g/dL (ref 13.0–17.0)
MCH: 29.5 pg (ref 26.0–34.0)
MCHC: 34.7 g/dL (ref 30.0–36.0)
MCV: 85 fL (ref 78.0–100.0)
Platelets: 148 10*3/uL — ABNORMAL LOW (ref 150–400)
RBC: 5.66 MIL/uL (ref 4.22–5.81)
RDW: 13 % (ref 11.5–15.5)
WBC: 9.7 10*3/uL (ref 4.0–10.5)

## 2015-12-21 MED ORDER — ASPIRIN 81 MG PO CHEW
324.0000 mg | CHEWABLE_TABLET | Freq: Once | ORAL | Status: DC
  Filled 2015-12-21: qty 4

## 2015-12-21 MED ORDER — ASPIRIN 81 MG PO CHEW
324.0000 mg | CHEWABLE_TABLET | Freq: Once | ORAL | Status: AC
  Administered 2015-12-21: 324 mg via ORAL

## 2015-12-21 MED ORDER — OMEPRAZOLE 20 MG PO CPDR: 20.0000 mg | DELAYED_RELEASE_CAPSULE | Freq: Every day | ORAL | Status: DC

## 2015-12-21 MED ORDER — GI COCKTAIL ~~LOC~~
30.0000 mL | Freq: Once | ORAL | Status: AC
  Administered 2015-12-21: 30 mL via ORAL
  Filled 2015-12-21: qty 30

## 2015-12-21 MED ORDER — PANTOPRAZOLE SODIUM 20 MG PO TBEC
20.0000 mg | DELAYED_RELEASE_TABLET | Freq: Once | ORAL | Status: DC
  Filled 2015-12-21: qty 1

## 2015-12-21 NOTE — ED Provider Notes (Signed)
CSN: [id number]     Arrival date & time 12/21/15  0357 History   First MD Initiated Contact with Patient 12/21/15 0602     Chief Complaint  Patient presents with  . Chest Pain     (Consider location/radiation/quality/duration/timing/severity/associated sxs/prior Treatment) HPI Comments: Patient is a 55 year old male who presents with central chest pain that began around 10 PM last evening. The patient describes the pain as a burning sensation with intermittent stabbing. The pain radiates to his left arm into his fourth and fifth fingers, as well as his right, but not as bad as his left. Patient has had 2 episodes of flash diaphoresis that lasted 10 minutes. The pain is worse with lying down, improves with walking, standing, sitting up. Patient tried ibuprofen with at 10 PM. Patient had a lot of garlic and watermelon last night, to foods that he doesn't normally eat. Patient denies any shortness of breath, abdominal pain, nausea, vomiting. Patient rates his pain as a 5/10 at this time, however it was at worst a 9/10. Patient was seen 2 other times in the past 18 months for similar symptoms. Patient has never seen cardiology or gastroenterology. 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 Patient denies any other recent long trips, surgeries, cancer, smoking.  Patient is a 55 y.o. male presenting with chest pain. The history is provided by the patient.  Chest Pain Associated symptoms: diaphoresis (2 episodes)   Associated symptoms: no abdominal pain, no back pain, no fever, no headache, no nausea, no shortness of breath and not vomiting     Past Medical History  Diagnosis Date  . Basal cell cancer     right arm   Past Surgical History  Procedure Laterality Date  . Basal cell carcinoma excision     Family History  Problem Relation Age of Onset  . Cancer Mother     breast  . Stomach cancer Father   . Colon cancer Neg Hx   . Esophageal cancer Neg  Hx   . Rectal cancer Neg Hx    Social History  Substance Use Topics  . Smoking status: Never Smoker   . Smokeless tobacco: Never Used  . Alcohol Use: Yes     Comment: occasional    Review of Systems  Constitutional: Positive for diaphoresis (2 episodes). Negative for fever and chills.  HENT: Negative for facial swelling.   Respiratory: Negative for shortness of breath.   Cardiovascular: Positive for chest pain.  Gastrointestinal: Negative for nausea, vomiting and abdominal pain.  Genitourinary: Negative for dysuria.  Musculoskeletal: Negative for back pain.  Skin: Negative for rash and wound.  Neurological: Negative for headaches.  Psychiatric/Behavioral: The patient is not nervous/anxious.       Allergies  Review of patient's allergies indicates no known allergies.  Home Medications   Prior to Admission medications   Medication Sig Start Date End Date Taking? Authorizing Provider  omeprazole (PRILOSEC) 20 MG capsule Take 1 capsule (20 mg total) by mouth daily. 12/21/15   Jola Schmidt, MD   BP 148/87 mmHg  Pulse 70  Temp(Src) 97.6 F (36.4 C) (Oral)  Resp 12  Ht 6\' 1"  (1.854 m)  Wt 117.935 kg  BMI 34.31 kg/m2  SpO2 96% Physical Exam  Constitutional: He appears well-developed and well-nourished. No distress.  HENT:  Head: Normocephalic and atraumatic.  Mouth/Throat: Oropharynx is clear and moist. No oropharyngeal exudate.  Eyes: Conjunctivae are normal. Pupils are equal, round,  and reactive to light. Right eye exhibits no discharge. Left eye exhibits no discharge. No scleral icterus.  Neck: Normal range of motion. Neck supple. No thyromegaly present.  Cardiovascular: Normal rate, regular rhythm, normal heart sounds and intact distal pulses.  Exam reveals no gallop and no friction rub.   No murmur heard. Pulmonary/Chest: Effort normal and breath sounds normal. No stridor. No respiratory distress. He has no wheezes. He has no rales. He exhibits no tenderness.   Abdominal: Soft. Bowel sounds are normal. He exhibits no distension. There is no tenderness. There is no rebound and no guarding.  Musculoskeletal: He exhibits no edema.  No calf TTP or edema   Lymphadenopathy:    He has no cervical adenopathy.  Neurological: He is alert. Coordination normal.  Skin: Skin is warm and dry. No rash noted. He is not diaphoretic. No pallor.  Psychiatric: He has a normal mood and affect.  Nursing note and vitals reviewed.   ED Course  Procedures (including critical care time) Labs Review Labs Reviewed  BASIC METABOLIC PANEL - Abnormal; Notable for the following:    Glucose, Bld 128 (*)    Calcium 8.8 (*)    All other components within normal limits  CBC - Abnormal; Notable for the following:    Platelets 148 (*)    All other components within normal limits  I-STAT TROPOININ, ED  Randolm Idol, ED    Imaging Review Dg Chest 2 View  12/21/2015  CLINICAL DATA:  Left-sided chest pain and shortness of breath. EXAM: CHEST  2 VIEW COMPARISON:  03/22/2015 FINDINGS: Normal heart size and mediastinal contours. No acute infiltrate or edema. No effusion or pneumothorax. No acute osseous findings. IMPRESSION: Negative chest. Electronically Signed   By: Monte Fantasia M.D.   On: 12/21/2015 05:13   I have personally reviewed and evaluated these images and lab results as part of my medical decision-making.   EKG Interpretation   Date/Time:  Monday December 21 2015 07:32:11 EDT Ventricular Rate:  65 PR Interval:  144 QRS Duration: 118 QT Interval:  419 QTC Calculation: 436 R Axis:   122 Text Interpretation:  Sinus rhythm Left posterior fascicular block ST  elev, probable normal early repol pattern No significant change was found  Confirmed by CAMPOS  MD, KEVIN ([id number]) on 12/21/2015 7:57:29 AM Also  confirmed by CAMPOS  MD, KEVIN ([id number]), editor [place]  CCT, BEVERLY  ([id number])  on 12/21/2015 7:59:24 AM      MDM   Patient presenting with chest pain. HEAR  score 3, <1% of MACE in 30 days. Delta troponin negative.  EKG and repeat show NSR, left posterior fascicular block, ST elevation, but probable normal early repo pattern; no significant change was found. CXR negative. CBC shows platelets 148 (patient donated platelets on Saturday). BMP shows glucose 128, calcium 8.8. Dr. Venora Maples and I doubt cardiac etiology. Will have patient follow up with PCP and cardiology for further evaluation. Patient discharged with Prilosec. Patient understands and agrees with plan. Return precautions discussed. Patient vitals stable and discharged in satisfactory condition. Patient also evaluated by Dr. Venora Maples who is in agreement with plan.  Final diagnoses:  Chest pain, unspecified chest pain type       Frederica Kuster, PA-C 12/21/15 [place], MD 12/22/15 [phone]

## 2015-12-21 NOTE — Discharge Instructions (Signed)
Nonspecific Chest Pain  Chest pain can be caused by many different conditions. There is always a chance that your pain could be related to something serious, such as a heart attack or a blood clot in your lungs. Chest pain can also be caused by conditions that are not life-threatening. If you have chest pain, it is very important to follow up with your health care provider. CAUSES  Chest pain can be caused by:  Heartburn.  Pneumonia or bronchitis.  Anxiety or stress.  Inflammation around your heart (pericarditis) or lung (pleuritis or pleurisy).  A blood clot in your lung.  A collapsed lung (pneumothorax). It can develop suddenly on its own (spontaneous pneumothorax) or from trauma to the chest.  Shingles infection (varicella-zoster virus).  Heart attack.  Damage to the bones, muscles, and cartilage that make up your chest wall. This can include:  Bruised bones due to injury.  Strained muscles or cartilage due to frequent or repeated coughing or overwork.  Fracture to one or more ribs.  Sore cartilage due to inflammation (costochondritis). RISK FACTORS  Risk factors for chest pain may include:  Activities that increase your risk for trauma or injury to your chest.  Respiratory infections or conditions that cause frequent coughing.  Medical conditions or overeating that can cause heartburn.  Heart disease or family history of heart disease.  Conditions or health behaviors that increase your risk of developing a blood clot.  Having had chicken pox (varicella zoster). SIGNS AND SYMPTOMS Chest pain can feel like:  Burning or tingling on the surface of your chest or deep in your chest.  Crushing, pressure, aching, or squeezing pain.  Dull or sharp pain that is worse when you move, cough, or take a deep breath.  Pain that is also felt in your back, neck, shoulder, or arm, or pain that spreads to any of these areas. Your chest pain may come and go, or it may stay  constant. DIAGNOSIS Lab tests or other studies may be needed to find the cause of your pain. Your health care provider may have you take a test called an ambulatory ECG (electrocardiogram). An ECG records your heartbeat patterns at the time the test is performed. You may also have other tests, such as:  Transthoracic echocardiogram (TTE). During echocardiography, sound waves are used to create a picture of all of the heart structures and to look at how blood flows through your heart.  Transesophageal echocardiogram (TEE).This is a more advanced imaging test that obtains images from inside your body. It allows your health care provider to see your heart in finer detail.  Cardiac monitoring. This allows your health care provider to monitor your heart rate and rhythm in real time.  Holter monitor. This is a portable device that records your heartbeat and can help to diagnose abnormal heartbeats. It allows your health care provider to track your heart activity for several days, if needed.  Stress tests. These can be done through exercise or by taking medicine that makes your heart beat more quickly.  Blood tests.  Imaging tests. TREATMENT  Your treatment depends on what is causing your chest pain. Treatment may include:  Medicines. These may include:  Acid blockers for heartburn.  Anti-inflammatory medicine.  Pain medicine for inflammatory conditions.  Antibiotic medicine, if an infection is present.  Medicines to dissolve blood clots.  Medicines to treat coronary artery disease.  Supportive care for conditions that do not require medicines. This may include:  Resting.  Applying heat  heartburn.    Anti-inflammatory medicine.    Pain medicine for inflammatory conditions.    Antibiotic medicine, if an infection is present.    Medicines to dissolve blood clots.    Medicines to treat coronary artery disease.   Supportive care for conditions that do not require medicines. This may include:    Resting.    Applying heat or cold packs to injured areas.    Limiting activities until pain decreases.  HOME CARE INSTRUCTIONS   If you were prescribed an antibiotic medicine, finish it all even if you start to feel better.   Avoid any activities that bring on chest pain.   Do not use any tobacco products, including  cigarettes, chewing tobacco, or electronic cigarettes. If you need help quitting, ask your health care provider.   Do not drink alcohol.   Take medicines only as directed by your health care provider.   Keep all follow-up visits as directed by your health care provider. This is important. This includes any further testing if your chest pain does not go away.   If heartburn is the cause for your chest pain, you may be told to keep your head raised (elevated) while sleeping. This reduces the chance that acid will go from your stomach into your esophagus.   Make lifestyle changes as directed by your health care provider. These may include:    Getting regular exercise. Ask your health care provider to suggest some activities that are safe for you.    Eating a heart-healthy diet. A registered dietitian can help you to learn healthy eating options.    Maintaining a healthy weight.    Managing diabetes, if necessary.    Reducing stress.  SEEK MEDICAL CARE IF:   Your chest pain does not go away after treatment.   You have a rash with blisters on your chest.   You have a fever.  SEEK IMMEDIATE MEDICAL CARE IF:    Your chest pain is worse.   You have an increasing cough, or you cough up blood.   You have severe abdominal pain.   You have severe weakness.   You faint.   You have chills.   You have sudden, unexplained chest discomfort.   You have sudden, unexplained discomfort in your arms, back, neck, or jaw.   You have shortness of breath at any time.   You suddenly start to sweat, or your skin gets clammy.   You feel nauseous or you vomit.   You suddenly feel light-headed or dizzy.   Your heart begins to beat quickly, or it feels like it is skipping beats.  These symptoms may represent a serious problem that is an emergency. Do not wait to see if the symptoms will go away. Get medical help right away. Call your local emergency services (911 in the [place] Do not drive yourself to the hospital.     This  information is not intended to replace advice given to you by your health care provider. Make sure you discuss any questions you have with your health care provider.     Document Released: 03/02/2005 Document Revised: 06/13/2014 Document Reviewed: 12/27/2013  Elsevier Interactive Patient Education 2016 [employer]  Food Choices for Gastroesophageal Reflux Disease, Adult  When you have gastroesophageal reflux disease (GERD), the foods you eat and your eating habits are very important. Choosing the right foods can help ease the discomfort of GERD.  WHAT GENERAL GUIDELINES DO I NEED TO FOLLOW?   Choose fruits,   vegetables, whole grains, low-fat dairy products, and low-fat meat, fish, and poultry.   Limit fats such as oils, salad dressings, butter, nuts, and avocado.   Keep a food diary to identify foods that cause symptoms.   Avoid foods that cause reflux. These may be different for different people.   Eat frequent small meals instead of three large meals each day.   Eat your meals slowly, in a relaxed setting.   Limit fried foods.   Cook foods using methods other than frying.   Avoid drinking alcohol.   Avoid drinking large amounts of liquids with your meals.   Avoid bending over or lying down until 2-3 hours after eating.  WHAT FOODS ARE NOT RECOMMENDED?  The following are some foods and drinks that may worsen your symptoms:  Vegetables  Tomatoes. Tomato juice. Tomato and spaghetti sauce. Chili peppers. Onion and garlic. Horseradish.  Fruits  Oranges, grapefruit, and lemon (fruit and juice).  Meats  High-fat meats, fish, and poultry. This includes hot dogs, ribs, ham, sausage, salami, and bacon.  Dairy  Whole milk and chocolate milk. Sour cream. Cream. Butter. Ice cream. Cream cheese.   Beverages  Coffee and tea, with or without caffeine. Carbonated beverages or energy drinks.  Condiments  Hot sauce. Barbecue sauce.   Sweets/Desserts  Chocolate and cocoa. Donuts. Peppermint and spearmint.  Fats and  Oils  High-fat foods, including French fries and potato chips.  Other  Vinegar. Strong spices, such as black pepper, white pepper, red pepper, cayenne, curry powder, cloves, ginger, and chili powder.  The items listed above may not be a complete list of foods and beverages to avoid. Contact your dietitian for more information.     This information is not intended to replace advice given to you by your health care provider. Make sure you discuss any questions you have with your health care provider.     Document Released: 05/23/2005 Document Revised: 06/13/2014 Document Reviewed: 03/27/2013  Elsevier Interactive Patient Education 2016 [employer]

## 2015-12-21 NOTE — ED Notes (Signed)
C/o pressure to center of chest with intermittent diaphoresis x 6 hours.  Denies sob, nausea, and vomiting.  Reports pain radiates down both arms (left worse than right).

## 2016-03-13 ENCOUNTER — Emergency Department (HOSPITAL_COMMUNITY)
Admission: EM | Admit: 2016-03-13 | Discharge: 2016-03-14 | Disposition: A | Discharge status: Home / Self Care | Payer: BLUE CROSS/BLUE SHIELD | Attending: Emergency Medicine | Admitting: Emergency Medicine

## 2016-03-13 ENCOUNTER — Encounter (HOSPITAL_COMMUNITY): Payer: Self-pay | Admitting: Emergency Medicine

## 2016-03-13 ENCOUNTER — Emergency Department (HOSPITAL_COMMUNITY): Payer: BLUE CROSS/BLUE SHIELD

## 2016-03-13 ENCOUNTER — Other Ambulatory Visit: Payer: Self-pay

## 2016-03-13 DIAGNOSIS — R079 Chest pain, unspecified: Secondary | ICD-10-CM | POA: Diagnosis not present

## 2016-03-13 DIAGNOSIS — R0789 Other chest pain: Secondary | ICD-10-CM | POA: Diagnosis not present

## 2016-03-13 DIAGNOSIS — Z85828 Personal history of other malignant neoplasm of skin: Secondary | ICD-10-CM | POA: Diagnosis not present

## 2016-03-13 LAB — BASIC METABOLIC PANEL
ANION GAP: 8 (ref 5–15)
BUN: 17 mg/dL (ref 6–20)
CO2: 23 mmol/L (ref 22–32)
Calcium: 9.1 mg/dL (ref 8.9–10.3)
Chloride: 106 mmol/L (ref 101–111)
Creatinine, Ser: 1.24 mg/dL (ref 0.61–1.24)
GLUCOSE: 141 mg/dL — AB (ref 65–99)
POTASSIUM: 3.6 mmol/L (ref 3.5–5.1)
Sodium: 137 mmol/L (ref 135–145)

## 2016-03-13 LAB — CBC
HEMATOCRIT: 45.2 % (ref 39.0–52.0)
HEMOGLOBIN: 15.3 g/dL (ref 13.0–17.0)
MCH: 29.9 pg (ref 26.0–34.0)
MCHC: 33.8 g/dL (ref 30.0–36.0)
MCV: 88.3 fL (ref 78.0–100.0)
PLATELETS: 218 10*3/uL (ref 150–400)
RBC: 5.12 MIL/uL (ref 4.22–5.81)
RDW: 12.6 % (ref 11.5–15.5)
WBC: 10.9 10*3/uL — ABNORMAL HIGH (ref 4.0–10.5)

## 2016-03-13 LAB — I-STAT TROPONIN, ED: TROPONIN I, POC: 0 ng/mL (ref 0.00–0.08)

## 2016-03-13 MED ORDER — GI COCKTAIL ~~LOC~~
30.0000 mL | Freq: Once | ORAL | Status: AC
  Administered 2016-03-14: 30 mL via ORAL
  Filled 2016-03-13: qty 30

## 2016-03-13 NOTE — ED Triage Notes (Signed)
Pt c/o CP since yesterday, starts in L side of chest that radiates to L arm. Pt states "i think its indigestion." Pt was seen here 3 months ago for same thing and had full workup without a reason for pain. Pt in NAD at this time.

## 2016-03-13 NOTE — ED Provider Notes (Signed)
[name] DEPT Provider Note   CSN: [id number] Arrival date & time: 03/13/16  2045  By signing my name below, I, Maud Deed. Royston Sinner, attest that this documentation has been prepared under the direction and in the presence of Merryl Hacker, MD.  Electronically Signed: Maud Deed. Royston Sinner, ED Scribe. 03/13/16. 11:42 PM.    History   Chief Complaint Chief Complaint  Patient presents with  . Chest Pain   The history is provided by the patient. No language interpreter was used.    HPI Comments: Bradley Miranda is a 55 y.o. male with a PMHx of HTN who presents to the Emergency Department complaining of intermittent, worsening L sided chest pain with radiating paresthesias into the L arm and 4th and 5th digit onset yesterday. Chest discomfort is described as burning and rated 2/10. Pain is exacerbated when supine. However, he states standing improves discomfort. Prilosec attempted at home without any long term improvement. No recent fever, chills, nausea, vomiting, or shortness of breath. He is not a smoker. Pt admits to a history of similar symptoms. States he was advised to follow with a cardiologist but has not scheduled an appointment yet. He admits he is prescribed Prilosec for every day use but admits he is non complaint.  PCP: Garret Reddish, MD    Past Medical History:  Diagnosis Date  . Basal cell cancer    right arm    There are no active problems to display for this patient.   Past Surgical History:  Procedure Laterality Date  . BASAL CELL CARCINOMA EXCISION         Home Medications    Prior to Admission medications   Medication Sig Start Date End Date Taking? Authorizing Provider  omeprazole (PRILOSEC) 20 MG capsule Take 1 capsule (20 mg total) by mouth daily. 03/14/16   Merryl Hacker, MD    Family History Family History  Problem Relation Age of Onset  . Cancer Mother     breast  . Stomach cancer Father   . Colon cancer Neg Hx   . Esophageal  cancer Neg Hx   . Rectal cancer Neg Hx     Social History Social History  Substance Use Topics  . Smoking status: Never Smoker  . Smokeless tobacco: Never Used  . Alcohol use Yes     Comment: occasional     Allergies   Review of patient's allergies indicates no known allergies.   Review of Systems Review of Systems  Constitutional: Positive for diaphoresis. Negative for chills and fever.  Respiratory: Negative for shortness of breath.   Cardiovascular: Positive for chest pain.  Gastrointestinal: Negative for abdominal pain, nausea and vomiting.  Musculoskeletal: Positive for arthralgias.  Neurological: Negative for headaches.  Psychiatric/Behavioral: Negative for confusion.  All other systems reviewed and are negative.    Physical Exam Updated Vital Signs BP 118/67   Pulse 72   Resp 15   SpO2 97%   Physical Exam  Constitutional: He is oriented to person, place, and time. He appears well-developed and well-nourished.  Overweight  HENT:  Head: Normocephalic and atraumatic.  Cardiovascular: Normal rate, regular rhythm and normal heart sounds.   No murmur heard. Pulmonary/Chest: Effort normal and breath sounds normal. No respiratory distress. He has no wheezes.  Abdominal: Soft. Bowel sounds are normal. There is no tenderness. There is no rebound.  Musculoskeletal: He exhibits no edema.  Neurological: He is alert and oriented to person, place, and time.  5 out of 5 strength  in all 4 extremities  Skin: Skin is warm and dry.  Psychiatric: He has a normal mood and affect.  Nursing note and vitals reviewed.    ED Treatments / Results   DIAGNOSTIC STUDIES: Oxygen Saturation is 98% on RA, Normal by my interpretation.    COORDINATION OF CARE: 11:42 PM- Wil order CXR, blood work, and EKG. Discussed treatment plan with pt at bedside and pt agreed to plan.     Labs (all labs ordered are listed, but only abnormal results are displayed) Labs Reviewed  BASIC  METABOLIC PANEL - Abnormal; Notable for the following:       Result Value   Glucose, Bld 141 (*)    All other components within normal limits  CBC - Abnormal; Notable for the following:    WBC 10.9 (*)    All other components within normal limits  I-STAT TROPOININ, ED  I-STAT TROPOININ, ED    EKG  EKG Interpretation  Date/Time:  Sunday March 13 2016 20:53:02 EDT Ventricular Rate:  82 PR Interval:  154 QRS Duration: 104 QT Interval:  382 QTC Calculation: 446 R Axis:   71 Text Interpretation:  Normal sinus rhythm Normal ECG No significant change since last tracing Confirmed by Ashok Cordia  MD, Lennette Bihari ([id number]) on 03/13/2016 10:40:38 PM       Radiology Dg Chest 2 View  Result Date: 03/13/2016 CLINICAL DATA:  Acute onset of left-sided chest pain and left arm pain. Initial encounter. EXAM: CHEST  2 VIEW COMPARISON:  Chest radiograph performed 12/21/2015 FINDINGS: The lungs are well-aerated and clear. There is no evidence of focal opacification, pleural effusion or pneumothorax. The heart is normal in size; the mediastinal contour is within normal limits. No acute osseous abnormalities are seen. IMPRESSION: No acute cardiopulmonary process seen. Electronically Signed   By: Garald Balding M.D.   On: 03/13/2016 22:08    Procedures Procedures (including critical care time)  Medications Ordered in ED Medications  gi cocktail (Maalox,Lidocaine,Donnatal) (30 mLs Oral Given 03/14/16 0028)     Initial Impression / Assessment and Plan / ED Course  I have reviewed the triage vital signs and the nursing notes.  Pertinent labs & imaging results that were available during my care of the patient were reviewed by me and considered in my medical decision making (see chart for details).  Clinical Course    Patient presents with recurrent chest pain. History is most suggestive of reflux. He is not compliant with his reflux medication. He's otherwise nontoxic. Vital signs notable for blood pressure  of 149/95. Heart score 3 given age and risk factors. EKG and troponin 2 negative. Patient with improvement of symptoms or GI cocktail. Again suspect GI etiology. However, given risk factors and recurrent pain, would recommend cardiology follow-up for definitive stress testing. Patient and wife stated understanding. I stressed to the patient the importance of daily PPI to suppress symptoms. He stated understanding and was given strict return precautions.  After history, exam, and medical workup I feel the patient has been appropriately medically screened and is safe for discharge home. Pertinent diagnoses were discussed with the patient. Patient was given return precautions.   Final Clinical Impressions(s) / ED Diagnoses   Final diagnoses:  Atypical chest pain    New Prescriptions Current Discharge Medication List     I personally performed the services described in this documentation, which was scribed in my presence. The recorded information has been reviewed and is accurate.      Merryl Hacker, MD  03/14/16 0114  

## 2016-03-14 ENCOUNTER — Telehealth: Payer: Self-pay | Admitting: Family Medicine

## 2016-03-14 ENCOUNTER — Encounter: Payer: Self-pay | Admitting: Cardiology

## 2016-03-14 LAB — I-STAT TROPONIN, ED: TROPONIN I, POC: 0.01 ng/mL (ref 0.00–0.08)

## 2016-03-14 MED ORDER — OMEPRAZOLE 20 MG PO CPDR: 20.0000 mg | DELAYED_RELEASE_CAPSULE | Freq: Every day | ORAL | 0 refills | Status: DC

## 2016-03-14 NOTE — ED Notes (Signed)
Pt and family understood Science writer. Script given at [employer]

## 2016-03-14 NOTE — Discharge Instructions (Signed)
You were seen today for chest pain. The cause of your pain is likely related to reflux; however, given your age, you need to follow-up closely with cardiology and have definitive stress testing. It is important that you take your acid reducer daily.

## 2016-03-14 NOTE — Telephone Encounter (Signed)
Perfect thanks

## 2016-03-14 NOTE — Telephone Encounter (Signed)
Pt has a new pcp

## 2016-03-15 ENCOUNTER — Encounter: Payer: Self-pay | Admitting: Cardiology

## 2016-03-15 ENCOUNTER — Ambulatory Visit (INDEPENDENT_AMBULATORY_CARE_PROVIDER_SITE_OTHER): Payer: BLUE CROSS/BLUE SHIELD | Admitting: Cardiology

## 2016-03-15 VITALS — BP 134/92 | HR 70 | Ht 73.0 in | Wt 258.4 lb

## 2016-03-15 DIAGNOSIS — R079 Chest pain, unspecified: Secondary | ICD-10-CM

## 2016-03-15 NOTE — Progress Notes (Signed)
Electrophysiology Office Note   Date:  03/15/2016   ID:  Bradley Miranda, DOB 09-Mar-1961, MRN [id number]  PCP:  Velna Hatchet, MD  Cardiologist:  Constance Haw, MD    Chief Complaint  Patient presents with  . New Patient (Initial Visit)    chest pain     History of Present Illness: Bradley Miranda is a 55 y.o. male who presents today for electrophysiology evaluation.   Hx htn presetned to the ER with intermittent L chest pain radiating to the L arm and 4-5 digits.  Exacerbated when supine. Standing improves discomfort. No change with prilosec. He did take a GI cocktail that helped his discomfort. He says that he has chest pain in the center of his chest that is sometimes worse when he is up and moving around, but does occur when he is stationary. He is able to work in his yard, but has noted that he becomes much more short of breath more recently. He also gets a shooting pain down both of his arms at times, though it is mostly on his left arm and his fourth and fifth digit. 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   Today, he denies symptoms of palpitations, chest pain, shortness of breath, orthopnea, PND, lower extremity edema, claudication, dizziness, presyncope, syncope, bleeding, or neurologic sequela. The patient is tolerating medications without difficulties and is otherwise without complaint today.    Past Medical History:  Diagnosis Date  . Basal cell cancer    right arm   Past Surgical History:  Procedure Laterality Date  . BASAL CELL CARCINOMA EXCISION       Current Outpatient Prescriptions  Medication Sig Dispense Refill  . omeprazole (PRILOSEC) 20 MG capsule Take 1 capsule (20 mg total) by mouth daily. 30 capsule 0   No current facility-administered medications for this visit.     Allergies:   Review of patient's allergies indicates no known allergies.   Social History:  The patient  reports that  he has never smoked. He has never used smokeless tobacco. He reports that he drinks alcohol. He reports that he does not use drugs.   Family History:  The patient's family history includes Cancer in his mother; Stomach cancer in his father.    ROS:  Please see the history of present illness.   Otherwise, review of systems is positive for Chest pain, chest pressure, diarrhea, back and muscle pain.   All other systems are reviewed and negative.    PHYSICAL EXAM: VS:  BP (!) 134/92   Pulse 70   Ht 6\' 1"  (1.854 m)   Wt 258 lb 6.4 oz (117.2 kg)   BMI 34.09 kg/m  , BMI Body mass index is 34.09 kg/m. GEN: Well nourished, well developed, in no acute distress  HEENT: normal  Neck: no JVD, carotid bruits, or masses Cardiac: RRR; no murmurs, rubs, or gallops,no edema  Respiratory:  clear to auscultation bilaterally, normal work of breathing GI: soft, nontender, nondistended, + BS MS: no deformity or atrophy  Skin: warm and dry Neuro:  Strength and sensation are intact Psych: euthymic mood, full affect  EKG:  EKG is ordered today. Personal review of the ekg ordered 03/13/16 shows sinus rhythm  Recent Labs: 03/13/2016: BUN 17; Creatinine, Ser 1.24; Hemoglobin 15.3; Platelets 218; Potassium 3.6; Sodium 137    Lipid Panel     Component Value Date/Time   CHOL 150  10/15/2012 0820   TRIG 89.0 10/15/2012 0820   HDL 50.20 10/15/2012 0820   CHOLHDL 3 10/15/2012 0820   VLDL 17.8 10/15/2012 0820   LDLCALC 82 10/15/2012 0820     Wt Readings from Last 3 Encounters:  03/15/16 258 lb 6.4 oz (117.2 kg)  12/21/15 260 lb (117.9 kg)  03/22/15 260 lb (117.9 kg)      Other studies Reviewed: Additional studies/ records that were reviewed today include: ER notes   ASSESSMENT AND PLAN:  1.  Chest pain: Her symptoms with both typical and atypical angina. He does have pain in the center of his chest, but it is not necessarily related to exertion. One worrisome sign, though, is that he does have  much more significant worsening shortness of breath with exertion. Due to his constellation of symptoms, we Lanika Colgate order a reststress Myoview. Hopefully this Fergus Throne give him some peace of mind if it is negative that this is not a cardiac issue.    Current medicines are reviewed at length with the patient today.   The patient Rest stress concerns regarding his medicines.  The following changes were made today:  none  Labs/ tests ordered today include:  Orders Placed This Encounter  Procedures  . Myocardial Perfusion Imaging     Disposition:   FU with Nithya Meriweather pending myoview  Signed, Audie Stayer Meredith Leeds, MD  03/15/2016 3:43 PM     [facility] [address] [place] [address] [phone] (office) [phone] (fax)

## 2016-03-15 NOTE — Patient Instructions (Signed)
Medication Instructions:    Your physician recommends that you continue on your current medications as directed. Please refer to the Current Medication list given to you today.  --- If you need a refill on your cardiac medications before your next appointment, please call your pharmacy. ---  Labwork:  None ordered  Testing/Procedures: Your physician has requested that you have a lexiscan myoview. For further information please visit [email or web address]. Please follow instruction sheet, as given.  Follow-Up:  Follow up to be determined after resting stress test.  We will call you with the results and if any follow up is needed.  Thank you for choosing [facility]!!   Trinidad Curet, RN [phone]    Any Other Special Instructions Will Be Listed Below (If Applicable).  Pharmacologic Stress Electrocardiogram A pharmacologic stress electrocardiogram is a heart (cardiac) test that uses nuclear imaging to evaluate the blood supply to your heart. This test may also be called a pharmacologic stress electrocardiography. Pharmacologic means that a medicine is used to increase your heart rate and blood pressure.  This stress test is done to find areas of poor blood flow to the heart by determining the extent of coronary artery disease (CAD). Some people exercise on a treadmill, which naturally increases the blood flow to the heart. For those people unable to exercise on a treadmill, a medicine is used. This medicine stimulates your heart and will cause your heart to beat harder and more quickly, as if you were exercising.  Pharmacologic stress tests can help determine:  The adequacy of blood flow to your heart during increased levels of activity in order to clear you for discharge home.  The extent of coronary artery blockage caused by CAD.  Your prognosis if you have suffered a heart attack.  The effectiveness of cardiac procedures done, such as an angioplasty, which can increase the  circulation in your coronary arteries.  Causes of chest pain or pressure. LET [facility] CARE PROVIDER KNOW ABOUT:  Any allergies you have.  All medicines you are taking, including vitamins, herbs, eye drops, creams, and over-the-counter medicines.  Previous problems you or members of your family have had with the use of anesthetics.  Any blood disorders you have.  Previous surgeries you have had.  Medical conditions you have.  Possibility of pregnancy, if this applies.  If you are currently breastfeeding. RISKS AND COMPLICATIONS Generally, this is a safe procedure. However, as with any procedure, complications can occur. Possible complications include:  You develop pain or pressure in the following areas:  Chest.  Jaw or neck.  Between your shoulder blades.  Radiating down your left arm.  Headache.  Dizziness or light-headedness.  Shortness of breath.  Increased or irregular heartbeat.  Low blood pressure.  Nausea or vomiting.  Flushing.  Redness going up the arm and slight pain during injection of medicine.  Heart attack (rare). BEFORE THE PROCEDURE   Avoid all forms of caffeine for 24 hours before your test or as directed by your health care provider. This includes coffee, tea (even decaffeinated tea), caffeinated sodas, chocolate, cocoa, and certain pain medicines.  Follow your health care provider's instructions regarding eating and drinking before the test.  Take your medicines as directed at regular times with water unless instructed otherwise. Exceptions may include:  If you have diabetes, ask how you are to take your insulin or pills. It is common to adjust insulin dosing the morning of the test.  If you are taking beta-blocker medicines, it is  important to talk to your health care provider about these medicines well before the date of your test. Taking beta-blocker medicines may interfere with the test. In some cases, these medicines need to be  changed or stopped 24 hours or more before the test.  If you wear a nitroglycerin patch, it may need to be removed prior to the test. Ask your health care provider if the patch should be removed before the test.  If you use an inhaler for any breathing condition, bring it with you to the test.  If you are an outpatient, bring a snack so you can eat right after the stress phase of the test.  Do not smoke for 4 hours prior to the test or as directed by your health care provider.  Do not apply lotions, powders, creams, or oils on your chest prior to the test.  Wear comfortable shoes and clothing. Let your health care provider know if you were unable to complete or follow the preparations for your test. PROCEDURE   Multiple patches (electrodes) will be put on your chest. If needed, small areas of your chest may be shaved to get better contact with the electrodes. Once the electrodes are attached to your body, multiple wires will be attached to the electrodes, and your heart rate will be monitored.  An IV access will be started. A nuclear trace (isotope) is given. The isotope may be given intravenously, or it may be swallowed. Nuclear refers to several types of radioactive isotopes, and the nuclear isotope lights up the arteries so that the nuclear images are clear. The isotope is absorbed by your body. This results in low radiation exposure.  A resting nuclear image is taken to show how your heart functions at rest.  A medicine is given through the IV access.  A second scan is done about 1 hour after the medicine injection and determines how your heart functions under stress.  During this stress phase, you will be connected to an electrocardiogram machine. Your blood pressure and oxygen levels will be monitored. AFTER THE PROCEDURE   Your heart rate and blood pressure will be monitored after the test.  You may return to your normal schedule, including diet,activities, and medicines,  unless your health care provider tells you otherwise.   This information is not intended to replace advice given to you by your health care provider. Make sure you discuss any questions you have with your health care provider.   Document Released: 10/09/2008 Document Revised: 05/28/2013 Document Reviewed: 01/28/2013 Elsevier Interactive Patient Education [employer].

## 2016-03-17 ENCOUNTER — Telehealth (HOSPITAL_COMMUNITY): Payer: Self-pay | Admitting: *Deleted

## 2016-03-17 NOTE — Telephone Encounter (Signed)
Patient given detailed instructions per Myocardial Perfusion Study Information Sheet for the test on 03/21/16. Patient notified to arrive 15 minutes early and that it is imperative to arrive on time for appointment to keep from having the test rescheduled.  If you need to cancel or reschedule your appointment, please call the office within 24 hours of your appointment. Failure to do so may result in a cancellation of your appointment, and a $50 no show fee. Patient verbalized understanding. Bradley Miranda    

## 2016-03-21 ENCOUNTER — Ambulatory Visit: Payer: BLUE CROSS/BLUE SHIELD | Admitting: Cardiology

## 2016-03-21 ENCOUNTER — Ambulatory Visit (HOSPITAL_COMMUNITY): Payer: BLUE CROSS/BLUE SHIELD | Attending: Cardiology

## 2016-03-21 DIAGNOSIS — R079 Chest pain, unspecified: Secondary | ICD-10-CM | POA: Insufficient documentation

## 2016-03-21 LAB — MYOCARDIAL PERFUSION IMAGING
CHL CUP NUCLEAR SDS: 0
LHR: 0.2
LVDIAVOL: 145 mL (ref 62–150)
LVSYSVOL: 81 mL
NUC STRESS TID: 1.04
Peak HR: 94 {beats}/min
Rest HR: 63 {beats}/min
SRS: 3
SSS: 3

## 2016-03-21 MED ORDER — TECHNETIUM TC 99M TETROFOSMIN IV KIT
10.9000 | PACK | Freq: Once | INTRAVENOUS | Status: AC | PRN
  Administered 2016-03-21: 10.9 via INTRAVENOUS
  Filled 2016-03-21: qty 11

## 2016-03-21 MED ORDER — TECHNETIUM TC 99M TETROFOSMIN IV KIT
32.4000 | PACK | Freq: Once | INTRAVENOUS | Status: AC | PRN
  Administered 2016-03-21: 32.4 via INTRAVENOUS
  Filled 2016-03-21: qty 33

## 2016-03-21 MED ORDER — REGADENOSON 0.4 MG/5ML IV SOLN
0.4000 mg | Freq: Once | INTRAVENOUS | Status: AC
  Administered 2016-03-21: 0.4 mg via INTRAVENOUS

## 2016-03-28 ENCOUNTER — Telehealth: Payer: Self-pay | Admitting: Cardiology

## 2016-03-28 DIAGNOSIS — R6889 Other general symptoms and signs: Secondary | ICD-10-CM

## 2016-03-28 NOTE — Telephone Encounter (Signed)
The patient is aware of his myoview results and is agreeable with scheduling and echo. I advised him I will order this and have scheduling contact him to arrange.

## 2016-03-28 NOTE — Telephone Encounter (Signed)
Bradley Miranda is returning a call to get his test results. Please call

## 2016-04-15 ENCOUNTER — Ambulatory Visit (HOSPITAL_COMMUNITY): Payer: BLUE CROSS/BLUE SHIELD | Attending: Cardiovascular Disease

## 2016-04-15 ENCOUNTER — Other Ambulatory Visit: Payer: Self-pay

## 2016-04-15 DIAGNOSIS — R6889 Other general symptoms and signs: Secondary | ICD-10-CM | POA: Diagnosis not present

## 2016-08-08 DIAGNOSIS — M25511 Pain in right shoulder: Secondary | ICD-10-CM | POA: Diagnosis not present

## 2016-08-15 DIAGNOSIS — I10 Essential (primary) hypertension: Secondary | ICD-10-CM | POA: Diagnosis not present

## 2016-08-15 DIAGNOSIS — Z125 Encounter for screening for malignant neoplasm of prostate: Secondary | ICD-10-CM | POA: Diagnosis not present

## 2016-08-15 DIAGNOSIS — Z0000 Encounter for general adult medical examination without abnormal findings: Secondary | ICD-10-CM | POA: Diagnosis not present

## 2016-08-19 DIAGNOSIS — I10 Essential (primary) hypertension: Secondary | ICD-10-CM | POA: Diagnosis not present

## 2016-08-19 DIAGNOSIS — Z1389 Encounter for screening for other disorder: Secondary | ICD-10-CM | POA: Diagnosis not present

## 2016-08-19 DIAGNOSIS — M5412 Radiculopathy, cervical region: Secondary | ICD-10-CM | POA: Diagnosis not present

## 2016-08-19 DIAGNOSIS — Z0000 Encounter for general adult medical examination without abnormal findings: Secondary | ICD-10-CM | POA: Diagnosis not present

## 2016-08-23 DIAGNOSIS — Z1212 Encounter for screening for malignant neoplasm of rectum: Secondary | ICD-10-CM | POA: Diagnosis not present

## 2017-01-27 DIAGNOSIS — L570 Actinic keratosis: Secondary | ICD-10-CM | POA: Diagnosis not present

## 2017-01-27 DIAGNOSIS — L820 Inflamed seborrheic keratosis: Secondary | ICD-10-CM | POA: Diagnosis not present

## 2017-01-27 DIAGNOSIS — D225 Melanocytic nevi of trunk: Secondary | ICD-10-CM | POA: Diagnosis not present

## 2017-01-27 DIAGNOSIS — Z85828 Personal history of other malignant neoplasm of skin: Secondary | ICD-10-CM | POA: Diagnosis not present

## 2017-01-27 DIAGNOSIS — B353 Tinea pedis: Secondary | ICD-10-CM | POA: Diagnosis not present

## 2017-01-27 DIAGNOSIS — L309 Dermatitis, unspecified: Secondary | ICD-10-CM | POA: Diagnosis not present

## 2017-03-23 DIAGNOSIS — S20412A Abrasion of left back wall of thorax, initial encounter: Secondary | ICD-10-CM | POA: Diagnosis not present

## 2017-03-23 DIAGNOSIS — Z23 Encounter for immunization: Secondary | ICD-10-CM | POA: Diagnosis not present

## 2017-06-13 DIAGNOSIS — J019 Acute sinusitis, unspecified: Secondary | ICD-10-CM | POA: Diagnosis not present

## 2017-06-15 DIAGNOSIS — J019 Acute sinusitis, unspecified: Secondary | ICD-10-CM | POA: Diagnosis not present

## 2017-07-13 ENCOUNTER — Encounter (HOSPITAL_COMMUNITY): Payer: Self-pay | Admitting: Emergency Medicine

## 2017-07-13 ENCOUNTER — Emergency Department (HOSPITAL_COMMUNITY): Payer: BLUE CROSS/BLUE SHIELD

## 2017-07-13 DIAGNOSIS — Z5321 Procedure and treatment not carried out due to patient leaving prior to being seen by health care provider: Secondary | ICD-10-CM | POA: Diagnosis not present

## 2017-07-13 DIAGNOSIS — R0989 Other specified symptoms and signs involving the circulatory and respiratory systems: Secondary | ICD-10-CM | POA: Diagnosis not present

## 2017-07-13 DIAGNOSIS — R079 Chest pain, unspecified: Secondary | ICD-10-CM | POA: Diagnosis not present

## 2017-07-13 LAB — BASIC METABOLIC PANEL
Anion gap: 11 (ref 5–15)
BUN: 13 mg/dL (ref 6–20)
CHLORIDE: 105 mmol/L (ref 101–111)
CO2: 22 mmol/L (ref 22–32)
Calcium: 9.2 mg/dL (ref 8.9–10.3)
Creatinine, Ser: 1.19 mg/dL (ref 0.61–1.24)
Glucose, Bld: 185 mg/dL — ABNORMAL HIGH (ref 65–99)
POTASSIUM: 4 mmol/L (ref 3.5–5.1)
SODIUM: 138 mmol/L (ref 135–145)

## 2017-07-13 LAB — CBC
HEMATOCRIT: 44.4 % (ref 39.0–52.0)
Hemoglobin: 15 g/dL (ref 13.0–17.0)
MCH: 27.7 pg (ref 26.0–34.0)
MCHC: 33.8 g/dL (ref 30.0–36.0)
MCV: 81.9 fL (ref 78.0–100.0)
PLATELETS: 225 10*3/uL (ref 150–400)
RBC: 5.42 MIL/uL (ref 4.22–5.81)
RDW: 14.6 % (ref 11.5–15.5)
WBC: 9.3 10*3/uL (ref 4.0–10.5)

## 2017-07-13 LAB — I-STAT TROPONIN, ED: Troponin i, poc: 0 ng/mL (ref 0.00–0.08)

## 2017-07-13 NOTE — ED Triage Notes (Signed)
Pt c/o burning chest pain that radiates down the left shoulder and arm x 3 days. Denies shortness of breath, nausea/vomiting/diaphoresis.

## 2017-07-14 ENCOUNTER — Emergency Department (HOSPITAL_COMMUNITY)
Admission: EM | Admit: 2017-07-14 | Discharge: 2017-07-14 | Disposition: A | Discharge status: Home / Self Care | Payer: BLUE CROSS/BLUE SHIELD | Attending: Emergency Medicine | Admitting: Emergency Medicine

## 2017-07-14 DIAGNOSIS — G479 Sleep disorder, unspecified: Secondary | ICD-10-CM | POA: Diagnosis not present

## 2017-07-14 DIAGNOSIS — R0683 Snoring: Secondary | ICD-10-CM | POA: Diagnosis not present

## 2017-07-14 DIAGNOSIS — R0789 Other chest pain: Secondary | ICD-10-CM | POA: Diagnosis not present

## 2017-07-14 DIAGNOSIS — E669 Obesity, unspecified: Secondary | ICD-10-CM | POA: Diagnosis not present

## 2017-07-14 NOTE — ED Notes (Signed)
Pt stated that he is tired and is going to go home. Pt stated that he is going to just see his doctor tomorrow and go over the results with him then. Pt has left at this time.

## 2017-08-10 ENCOUNTER — Emergency Department (HOSPITAL_COMMUNITY): Payer: BLUE CROSS/BLUE SHIELD

## 2017-08-10 ENCOUNTER — Encounter (HOSPITAL_COMMUNITY): Payer: Self-pay | Admitting: Emergency Medicine

## 2017-08-10 ENCOUNTER — Other Ambulatory Visit: Payer: Self-pay

## 2017-08-10 ENCOUNTER — Emergency Department (HOSPITAL_COMMUNITY)
Admission: EM | Admit: 2017-08-10 | Discharge: 2017-08-10 | Disposition: A | Discharge status: Home / Self Care | Payer: BLUE CROSS/BLUE SHIELD | Attending: Emergency Medicine | Admitting: Emergency Medicine

## 2017-08-10 DIAGNOSIS — R072 Precordial pain: Secondary | ICD-10-CM | POA: Diagnosis not present

## 2017-08-10 DIAGNOSIS — R079 Chest pain, unspecified: Secondary | ICD-10-CM | POA: Diagnosis not present

## 2017-08-10 DIAGNOSIS — K760 Fatty (change of) liver, not elsewhere classified: Secondary | ICD-10-CM | POA: Diagnosis not present

## 2017-08-10 LAB — BASIC METABOLIC PANEL
Anion gap: 9 (ref 5–15)
BUN: 11 mg/dL (ref 6–20)
CALCIUM: 8.9 mg/dL (ref 8.9–10.3)
CHLORIDE: 107 mmol/L (ref 101–111)
CO2: 23 mmol/L (ref 22–32)
CREATININE: 1.04 mg/dL (ref 0.61–1.24)
Glucose, Bld: 122 mg/dL — ABNORMAL HIGH (ref 65–99)
Potassium: 3.5 mmol/L (ref 3.5–5.1)
SODIUM: 139 mmol/L (ref 135–145)

## 2017-08-10 LAB — CBC
HCT: 45.6 % (ref 39.0–52.0)
Hemoglobin: 15.5 g/dL (ref 13.0–17.0)
MCH: 27.9 pg (ref 26.0–34.0)
MCHC: 34 g/dL (ref 30.0–36.0)
MCV: 82 fL (ref 78.0–100.0)
PLATELETS: 210 10*3/uL (ref 150–400)
RBC: 5.56 MIL/uL (ref 4.22–5.81)
RDW: 13.9 % (ref 11.5–15.5)
WBC: 9.7 10*3/uL (ref 4.0–10.5)

## 2017-08-10 LAB — I-STAT TROPONIN, ED: TROPONIN I, POC: 0 ng/mL (ref 0.00–0.08)

## 2017-08-10 MED ORDER — FAMOTIDINE 20 MG PO TABS
20.0000 mg | ORAL_TABLET | Freq: Once | ORAL | Status: AC
  Administered 2017-08-10: 20 mg via ORAL
  Filled 2017-08-10: qty 1

## 2017-08-10 MED ORDER — PANTOPRAZOLE SODIUM 40 MG PO TBEC: 40.0000 mg | DELAYED_RELEASE_TABLET | Freq: Every day | ORAL | 0 refills | Status: DC

## 2017-08-10 MED ORDER — GI COCKTAIL ~~LOC~~
30.0000 mL | Freq: Once | ORAL | Status: AC
  Administered 2017-08-10: 30 mL via ORAL
  Filled 2017-08-10: qty 30

## 2017-08-10 NOTE — Discharge Instructions (Signed)
It was our pleasure to provide your ER care today - we hope that you feel better.  Take protonix (acid blocker medication). You may also take pepcid and maalox as need for symptom relief.  Follow up with your doctor tomorrow as already arranged - also have your blood pressure rechecked then.   Return to ER if worse, new symptoms, trouble breathing, persistent vomiting, recurrent/persistent chest pain, other concern.

## 2017-08-10 NOTE — ED Provider Notes (Signed)
[facility] Provider Note   CSN: [id number] Arrival date & time: 08/10/17  0417     History   Chief Complaint Chief Complaint  Patient presents with  . Chest Pain    HPI Bradley Miranda is a 57 y.o. male.  Patient c/o xiphoid area/lower chest pain, in midline, for the past day. Symptoms occurred at rest. Moderate, persistent. Occasionally feels in back and shoulder area. Hx similar pain in past, episodically, occasionally worse after eating certain foods. No relation to activity or exertion. Not pleuritic. Denies associated sob or diaphoresis. No vomiting. Denies cough or uri symptoms. No fever or chills. No hx gallstones. No personal or family hx cad. Notes prior stress test for same symptoms normal. +hx gerd. Does not recent gas, belching, heartburn.    The history is provided by the patient.  Chest Pain   Pertinent negatives include no abdominal pain, no fever, no headaches and no shortness of breath.    Past Medical History:  Diagnosis Date  . Basal cell cancer    right arm    There are no active problems to display for this patient.   Past Surgical History:  Procedure Laterality Date  . BASAL CELL CARCINOMA EXCISION         Home Medications    Prior to Admission medications   Medication Sig Start Date End Date Taking? Authorizing Provider  omeprazole (PRILOSEC) 20 MG capsule Take 1 capsule (20 mg total) by mouth daily. 03/14/16   Horton, Barbette Hair, MD    Family History Family History  Problem Relation Age of Onset  . Cancer Mother        breast  . Stomach cancer Father   . Colon cancer Neg Hx   . Esophageal cancer Neg Hx   . Rectal cancer Neg Hx     Social History Social History   Tobacco Use  . Smoking status: Never Smoker  . Smokeless tobacco: Never Used  Substance Use Topics  . Alcohol use: Yes    Comment: occasional  . Drug use: No     Allergies   Patient has no known allergies.   Review  of Systems Review of Systems  Constitutional: Negative for fever.  HENT: Negative for sore throat.   Eyes: Negative for redness.  Respiratory: Negative for shortness of breath.   Cardiovascular: Positive for chest pain.  Gastrointestinal: Negative for abdominal pain.  Genitourinary: Negative for flank pain.  Musculoskeletal: Negative for neck pain.  Skin: Negative for rash.  Neurological: Negative for headaches.  Hematological: Does not bruise/bleed easily.  Psychiatric/Behavioral: Negative for confusion.     Physical Exam Updated Vital Signs BP 129/73   Pulse 67   Temp 98.5 F (36.9 C) (Oral)   Resp 16   Ht 1.829 m (6')   Wt 113.4 kg (250 lb)   SpO2 98%   BMI 33.91 kg/m   Physical Exam  Constitutional: He appears well-developed and well-nourished. No distress.  HENT:  Head: Atraumatic.  Mouth/Throat: Oropharynx is clear and moist.  Eyes: Conjunctivae are normal.  Neck: Neck supple. No tracheal deviation present.  Cardiovascular: Normal rate, regular rhythm, normal heart sounds and intact distal pulses. Exam reveals no gallop and no friction rub.  No murmur heard. Pulmonary/Chest: Effort normal and breath sounds normal. No accessory muscle usage. No respiratory distress. He exhibits no tenderness.  Abdominal: Soft. Bowel sounds are normal. He exhibits no distension. There is no tenderness.  Genitourinary:  Genitourinary Comments: No  cva tenderness  Musculoskeletal: He exhibits no edema.  Neurological: He is alert.  Skin: Skin is warm and dry. He is not diaphoretic.  Psychiatric: He has a normal mood and affect.  Nursing note and vitals reviewed.    ED Treatments / Results  Labs (all labs ordered are listed, but only abnormal results are displayed) Results for orders placed or performed during the hospital encounter of 98/33/82  Basic metabolic panel  Result Value Ref Range   Sodium 139 135 - 145 mmol/L   Potassium 3.5 3.5 - 5.1 mmol/L   Chloride 107 101 -  111 mmol/L   CO2 23 22 - 32 mmol/L   Glucose, Bld 122 (H) 65 - 99 mg/dL   BUN 11 6 - 20 mg/dL   Creatinine, Ser 1.04 0.61 - 1.24 mg/dL   Calcium 8.9 8.9 - 10.3 mg/dL   GFR calc non Af Amer >60 >60 mL/min   GFR calc Af Amer >60 >60 mL/min   Anion gap 9 5 - 15  CBC  Result Value Ref Range   WBC 9.7 4.0 - 10.5 K/uL   RBC 5.56 4.22 - 5.81 MIL/uL   Hemoglobin 15.5 13.0 - 17.0 g/dL   HCT 45.6 39.0 - 52.0 %   MCV 82.0 78.0 - 100.0 fL   MCH 27.9 26.0 - 34.0 pg   MCHC 34.0 30.0 - 36.0 g/dL   RDW 13.9 11.5 - 15.5 %   Platelets 210 150 - 400 K/uL  I-stat troponin, ED  Result Value Ref Range   Troponin i, poc 0.00 0.00 - 0.08 ng/mL   Comment 3           Dg Chest 2 View  Result Date: 08/10/2017 CLINICAL DATA:  Chest pain. EXAM: CHEST - 2 VIEW COMPARISON:  07/13/2017 FINDINGS: The cardiomediastinal contours are normal. The lungs are clear. Pulmonary vasculature is normal. No consolidation, pleural effusion, or pneumothorax. No acute osseous abnormalities are seen. IMPRESSION: No acute pulmonary process. Electronically Signed   By: Jeb Levering M.D.   On: 08/10/2017 04:51   Dg Chest 2 View  Result Date: 07/13/2017 CLINICAL DATA:  Burning sensation. EXAM: CHEST  2 VIEW COMPARISON:  March 13, 2016 FINDINGS: The heart size and mediastinal contours are within normal limits. Both lungs are clear. The visualized skeletal structures are unremarkable. IMPRESSION: No active cardiopulmonary disease. Electronically Signed   By: Dorise Bullion III M.D   On: 07/13/2017 22:00    EKG  EKG Interpretation  Date/Time:  Thursday August 10 2017 04:27:45 EST Ventricular Rate:  68 PR Interval:  152 QRS Duration: 116 QT Interval:  420 QTC Calculation: 446 R Axis:   65 Text Interpretation:  Normal sinus rhythm Normal ECG Confirmed by Lajean Saver [phone]) on 08/10/2017 7:59:03 AM       Radiology Dg Chest 2 View  Result Date: 08/10/2017 CLINICAL DATA:  Chest pain. EXAM: CHEST - 2 VIEW COMPARISON:   07/13/2017 FINDINGS: The cardiomediastinal contours are normal. The lungs are clear. Pulmonary vasculature is normal. No consolidation, pleural effusion, or pneumothorax. No acute osseous abnormalities are seen. IMPRESSION: No acute pulmonary process. Electronically Signed   By: Jeb Levering M.D.   On: 08/10/2017 04:51   US Abdomen Limited  Result Date: 08/10/2017 CLINICAL DATA:  Burning epigastric pain for the past month and a half. EXAM: ULTRASOUND ABDOMEN LIMITED RIGHT UPPER QUADRANT COMPARISON:  None. FINDINGS: Gallbladder: No gallstones or wall thickening visualized. No sonographic Murphy sign noted by sonographer. Common bile duct: Diameter:  4 mm, normal. Liver: No focal lesion identified. Increased in parenchymal echogenicity. Portal vein is patent on color Doppler imaging with normal direction of blood flow towards the liver. IMPRESSION: 1. No acute abnormality. 2. Hepatic steatosis. Electronically Signed   By: Titus Dubin M.D.   On: 08/10/2017 09:41    Procedures Procedures (including critical care time)  Medications Ordered in ED Medications - No data to display   Initial Impression / Assessment and Plan / ED Course  I have reviewed the triage vital signs and the nursing notes.  Pertinent labs & imaging results that were available during my care of the patient were reviewed by me and considered in my medical decision making (see chart for details).  Labs sent. Cxr. Ecg.  Given chest pain, diff dx includes cad/angina, PE, gerd, esoph spasm, biliary colic, chest wall pain.   Reviewed nursing notes and prior charts for additional history.   Reviewed labs, after symptoms present for past 20 hrs, trop is 0. Symptoms/workup does not appear c/w acs.   cxr reviewed - no pna.   Will try gi meds for symptom relief. pepcid and gi cocktail po.    Final Clinical Impressions(s) / ED Diagnoses   Final diagnoses:  None    ED Discharge Orders    None       Lajean Saver,  MD 08/10/17 1016

## 2017-08-10 NOTE — ED Triage Notes (Signed)
Pt c/o 7/10 burning sensation on his chest, pt denies any nausea or vomiting no SOB.

## 2017-08-11 DIAGNOSIS — K219 Gastro-esophageal reflux disease without esophagitis: Secondary | ICD-10-CM | POA: Diagnosis not present

## 2017-08-11 DIAGNOSIS — G4709 Other insomnia: Secondary | ICD-10-CM | POA: Diagnosis not present

## 2017-08-11 DIAGNOSIS — I10 Essential (primary) hypertension: Secondary | ICD-10-CM | POA: Diagnosis not present

## 2017-08-11 DIAGNOSIS — Z6835 Body mass index (BMI) 35.0-35.9, adult: Secondary | ICD-10-CM | POA: Diagnosis not present

## 2017-08-14 ENCOUNTER — Encounter: Payer: Self-pay | Admitting: Gastroenterology

## 2017-08-18 DIAGNOSIS — J019 Acute sinusitis, unspecified: Secondary | ICD-10-CM | POA: Diagnosis not present

## 2017-08-18 DIAGNOSIS — R030 Elevated blood-pressure reading, without diagnosis of hypertension: Secondary | ICD-10-CM | POA: Diagnosis not present

## 2017-08-21 DIAGNOSIS — K219 Gastro-esophageal reflux disease without esophagitis: Secondary | ICD-10-CM | POA: Diagnosis not present

## 2017-08-24 ENCOUNTER — Encounter: Payer: Self-pay | Admitting: Neurology

## 2017-08-24 ENCOUNTER — Ambulatory Visit: Payer: BLUE CROSS/BLUE SHIELD | Admitting: Neurology

## 2017-08-24 VITALS — BP 141/84 | HR 70 | Ht 72.0 in | Wt 253.0 lb

## 2017-08-24 DIAGNOSIS — R0789 Other chest pain: Secondary | ICD-10-CM | POA: Diagnosis not present

## 2017-08-24 DIAGNOSIS — R351 Nocturia: Secondary | ICD-10-CM

## 2017-08-24 DIAGNOSIS — K219 Gastro-esophageal reflux disease without esophagitis: Secondary | ICD-10-CM | POA: Diagnosis not present

## 2017-08-24 DIAGNOSIS — R0683 Snoring: Secondary | ICD-10-CM | POA: Diagnosis not present

## 2017-08-24 DIAGNOSIS — E669 Obesity, unspecified: Secondary | ICD-10-CM | POA: Diagnosis not present

## 2017-08-24 NOTE — Patient Instructions (Addendum)

## 2017-08-24 NOTE — Progress Notes (Signed)
Subjective:    Patient ID: Bradley Miranda is a 57 y.o. male.  HPI     Star Age, MD, PhD [facility] [address] [place], [place] [address]  Dear Collie Siad and Dr. Ardeth Perfect,   I saw your patient, Bradley Miranda, upon your kind request in my neurologic clinic today for initial consultation of his sleep disorder, in particular, concern for underlying obstructive sleep apnea. The patient is unaccompanied today. As you know, Bradley Miranda 57 year old right-handed gentleman with an underlying medical history of neck pain, history of degenerative disc disease, shoulder pain, reflux disease, atypical chest pain with recent ER visit, and obesity, who reports snoring and recent elevation of his blood pressure values. I reviewed your office note from 07/14/2017, which you kindly included. Sleepiness score is 4 out of 24, fatigue score is 9 out of 63. He feels that his sleep is fairly adequate. He is married and lives with his wife, he works in [employer] which involves both air and road travel. He tries to be admitted between 9:30 and 10 typically, he does wake up early to go to the gym. He is trying to lose weight. Rise time around 4:15 AM typically. He has nocturia maybe once or twice per average night. He denies morning headaches. He has had recent indigestion, increase in reflux, also chest pain, went to the emergency room this month. He had an ultrasound of his abdomen which showed no significant issues with his gallbladder. He is scheduled to have an upper GI endoscopy next week. He is not aware of any family history of OSA. Father snored. Both parents smoked. He is a nonsmoker and does not utilize alcohol, he is in the process of reducing his caffeine intake which was primarily in the form of iced tea. He is now limited to 1 per day on average. He is finishing up a course of Augmentin. He had oral steroids recently for an upper respiratory infection.  Her  Past Medical History Is Significant For: Past Medical History:  Diagnosis Date  . Basal cell cancer    right arm  . Shoulder pain, bilateral     Her Past Surgical History Is Significant For: Past Surgical History:  Procedure Laterality Date  . BASAL CELL CARCINOMA EXCISION      Her Family History Is Significant For: Family History  Problem Relation Age of Onset  . Cancer Mother        breast  . Stomach cancer Father   . Colon cancer Neg Hx   . Esophageal cancer Neg Hx   . Rectal cancer Neg Hx     Her Social History Is Significant For: Social History   Socioeconomic History  . Marital status: Married    Spouse name: Not on file  . Number of children: Not on file  . Years of education: Not on file  . Highest education level: Not on file  Occupational History  . Not on file  Social Needs  . Financial resource strain: Not on file  . Food insecurity:    Worry: Not on file    Inability: Not on file  . Transportation needs:    Medical: Not on file    Non-medical: Not on file  Tobacco Use  . Smoking status: Never Smoker  . Smokeless tobacco: Never Used  Substance and Sexual Activity  . Alcohol use: Yes    Comment: occasional  . Drug use: No  . Sexual activity: Not on file  Lifestyle  . Physical activity:    Days per week: Not on file    Minutes per session: Not on file  . Stress: Not on file  Relationships  . Social connections:    Talks on phone: Not on file    Gets together: Not on file    Attends religious service: Not on file    Active member of club or organization: Not on file    Attends meetings of clubs or organizations: Not on file    Relationship status: Not on file  Other Topics Concern  . Not on file  Social History Narrative  . Not on file    Her Allergies Are:  No Known Allergies:   Her Current Medications Are:  Outpatient Encounter Medications as of 08/24/2017  Medication Sig  . amoxicillin-clavulanate (AUGMENTIN) 875-125 MG tablet  amoxicillin 875 mg-potassium clavulanate 125 mg tablet  Take 1 tablet every 12 hours by oral route for 14 days.  . pantoprazole (PROTONIX) 40 MG tablet Take 1 tablet (40 mg total) by mouth daily.  . [DISCONTINUED] methocarbamol (ROBAXIN) 500 MG tablet Take 500 mg by mouth 4 (four) times daily.  . [DISCONTINUED] omeprazole (PRILOSEC) 20 MG capsule Take 1 capsule (20 mg total) by mouth daily.   No facility-administered encounter medications on file as of 08/24/2017.   :  Review of Systems:  Out of a complete 14 point review of systems, all are reviewed and negative with the exception of these symptoms as listed below:  Review of Systems  Neurological:       Patient here to discuss his snoring and hypertension, his PCP would like to r/o sleep apnea before considering BP medication.   Epworth Sleepiness Scale 0= would never doze 1= slight chance of dozing 2= moderate chance of dozing 3= high chance of dozing  Sitting and reading:1 Watching TV:3 Sitting inactive in a public place (ex. Theater or meeting):0 As a passenger in a car for an hour without a break:0 Lying down to rest in the afternoon:0 Sitting and talking to someone:0 Sitting quietly after lunch (no alcohol):0 In a car, while stopped in traffic:0 Total:4     Objective:  Neurological Exam  Physical Exam Physical Examination:   Vitals:   08/24/17 1528  BP: (!) 141/84  Pulse: 70   General Examination: The patient is a very pleasant 57 y.o. male in no acute distress. She appears well-developed and well-nourished and well groomed.    HEENT: Normocephalic, atraumatic, pupils are equal, round and reactive to light and accommodation. Extraocular tracking is good without limitation to gaze excursion or nystagmus noted. Normal smooth pursuit is noted. Hearing is grossly intact. Face is symmetric with normal facial animation and normal facial sensation. Speech is clear with no dysarthria noted. There is no hypophonia. There  is no lip, neck/head, jaw or voice tremor. Neck is supple with full range of passive and active motion. There are no carotid bruits on auscultation. Oropharynx exam reveals: mild mouth dryness, adequate dental hygiene and moderate airway crowding, due to smaller airway entry, tonsils are 2+ bilaterally, uvula slightly larger mildly swollen, mild pharyngeal erythema noted. Mallampati is class II. Tongue protrudes centrally and palate elevates symmetrically. Neck circumference is 18 and 78 inches.  Chest: Clear to auscultation without wheezing, rhonchi or crackles noted.  Heart: S1+S2+0, regular and normal without murmurs, rubs or gallops noted.   Abdomen: Soft, non-tender and non-distended with normal bowel sounds appreciated on auscultation.  Extremities: There is no edema in the  distal lower extremities bilaterally.  Skin: Warm and dry without trophic changes noted.  Musculoskeletal: exam reveals no obvious joint deformities, tenderness or joint swelling or erythema.   Neurologically:  Mental status: The patient is awake, alert and oriented in all 4 spheres. His immediate and remote memory, attention, language skills and fund of knowledge are appropriate. There is no evidence of aphasia, agnosia, apraxia or anomia. Speech is clear with normal prosody and enunciation. Thought process is linear. Mood is normal and affect is normal.  Cranial nerves II - XII are as described above under HEENT exam. In addition: shoulder shrug is normal with equal shoulder height noted. Motor exam: Normal bulk, strength and tone is noted. There is no tremor or rebound. Fine motor skills and coordination: grossly intact.  Cerebellar testing: No dysmetria or intention tremor. There is no truncal or gait ataxia.  Sensory exam: intact to light touch in the upper and lower extremities.  Gait, station and balance: He stands easily. No veering to one side is noted. No leaning to one side is noted. Posture is age-appropriate  and stance is narrow based. Gait shows normal stride length and normal pace. No problems turning are noted.   Assessment and Plan:   In summary, Bradley Miranda is a very pleasant 57 y.o.-year old male with an underlying medical history of neck pain, history of degenerative disc disease, shoulder pain, reflux disease, atypical chest pain with recent ER visit, and obesity, whose history and physical exam are concerning for obstructive sleep apnea (OSA). I had a long chat with the patient about my findings and the diagnosis of OSA, its prognosis and treatment options. We talked about medical treatments, surgical interventions and non-pharmacological approaches. I explained in particular the risks and ramifications of untreated moderate to severe OSA, especially with respect to developing cardiovascular disease down the Road, including congestive heart failure, difficult to treat hypertension, cardiac arrhythmias, or stroke. Even type 2 diabetes has, in part, been linked to untreated OSA. Symptoms of untreated OSA include daytime sleepiness, memory problems, mood irritability and mood disorder such as depression and anxiety, lack of energy, as well as recurrent headaches, especially morning headaches. We talked about trying to maintain a healthy lifestyle in general, as well as the importance of weight control. I encouraged the patient to eat healthy, exercise daily and keep well hydrated, to keep a scheduled bedtime and wake time routine, to not skip any meals and eat healthy snacks in between meals. I advised the patient not to drive when feeling sleepy. I recommended the following at this time: sleep study with potential positive airway pressure titration. (We will score hypopneas at 3%).   I explained the sleep test procedure to the patient and also explained the CPAP treatment option to the patient, who indicated that he would be willing to try CPAP if the need arises. I explained the importance of  being compliant with PAP treatment, not only for insurance purposes but primarily to improve His symptoms, and for the patient's long term health benefit, including to reduce His cardiovascular risks. I answered all his questions today and the patient was in agreement. I would like to see him back after the sleep study is completed and encouraged him to call with any interim questions, concerns, problems or updates.   Thank you very much for allowing me to participate in the care of this nice patient. If I can be of any further assistance to you please do not hesitate to call me  at [phone].  Sincerely,   Star Age, MD, PhD

## 2017-08-29 DIAGNOSIS — Z0000 Encounter for general adult medical examination without abnormal findings: Secondary | ICD-10-CM | POA: Diagnosis not present

## 2017-08-29 DIAGNOSIS — I10 Essential (primary) hypertension: Secondary | ICD-10-CM | POA: Diagnosis not present

## 2017-08-29 DIAGNOSIS — Z125 Encounter for screening for malignant neoplasm of prostate: Secondary | ICD-10-CM | POA: Diagnosis not present

## 2017-08-31 DIAGNOSIS — K219 Gastro-esophageal reflux disease without esophagitis: Secondary | ICD-10-CM | POA: Diagnosis not present

## 2017-08-31 DIAGNOSIS — R12 Heartburn: Secondary | ICD-10-CM | POA: Diagnosis not present

## 2017-09-01 DIAGNOSIS — R1013 Epigastric pain: Secondary | ICD-10-CM | POA: Diagnosis not present

## 2017-09-01 DIAGNOSIS — Z1212 Encounter for screening for malignant neoplasm of rectum: Secondary | ICD-10-CM | POA: Diagnosis not present

## 2017-09-01 DIAGNOSIS — M5030 Other cervical disc degeneration, unspecified cervical region: Secondary | ICD-10-CM | POA: Diagnosis not present

## 2017-09-01 DIAGNOSIS — R0789 Other chest pain: Secondary | ICD-10-CM | POA: Diagnosis not present

## 2017-09-08 DIAGNOSIS — Z1389 Encounter for screening for other disorder: Secondary | ICD-10-CM | POA: Diagnosis not present

## 2017-09-08 DIAGNOSIS — Z1159 Encounter for screening for other viral diseases: Secondary | ICD-10-CM | POA: Diagnosis not present

## 2017-09-08 DIAGNOSIS — G479 Sleep disorder, unspecified: Secondary | ICD-10-CM | POA: Diagnosis not present

## 2017-09-08 DIAGNOSIS — Z125 Encounter for screening for malignant neoplasm of prostate: Secondary | ICD-10-CM | POA: Diagnosis not present

## 2017-09-08 DIAGNOSIS — M5030 Other cervical disc degeneration, unspecified cervical region: Secondary | ICD-10-CM | POA: Diagnosis not present

## 2017-09-08 DIAGNOSIS — E668 Other obesity: Secondary | ICD-10-CM | POA: Diagnosis not present

## 2017-09-08 DIAGNOSIS — I10 Essential (primary) hypertension: Secondary | ICD-10-CM | POA: Diagnosis not present

## 2017-09-08 DIAGNOSIS — K219 Gastro-esophageal reflux disease without esophagitis: Secondary | ICD-10-CM | POA: Diagnosis not present

## 2017-09-08 DIAGNOSIS — R0789 Other chest pain: Secondary | ICD-10-CM | POA: Diagnosis not present

## 2017-09-08 DIAGNOSIS — R1013 Epigastric pain: Secondary | ICD-10-CM | POA: Diagnosis not present

## 2017-09-08 DIAGNOSIS — Z0000 Encounter for general adult medical examination without abnormal findings: Secondary | ICD-10-CM | POA: Diagnosis not present

## 2017-09-21 DIAGNOSIS — R079 Chest pain, unspecified: Secondary | ICD-10-CM | POA: Diagnosis not present

## 2017-09-21 DIAGNOSIS — K219 Gastro-esophageal reflux disease without esophagitis: Secondary | ICD-10-CM | POA: Diagnosis not present

## 2017-09-22 ENCOUNTER — Emergency Department (HOSPITAL_COMMUNITY)
Admission: EM | Admit: 2017-09-22 | Discharge: 2017-09-23 | Disposition: A | Discharge status: Home / Self Care | Payer: BLUE CROSS/BLUE SHIELD | Attending: Emergency Medicine | Admitting: Emergency Medicine

## 2017-09-22 ENCOUNTER — Other Ambulatory Visit: Payer: Self-pay

## 2017-09-22 ENCOUNTER — Encounter (HOSPITAL_COMMUNITY): Payer: Self-pay

## 2017-09-22 DIAGNOSIS — Z79899 Other long term (current) drug therapy: Secondary | ICD-10-CM | POA: Diagnosis not present

## 2017-09-22 DIAGNOSIS — R42 Dizziness and giddiness: Secondary | ICD-10-CM | POA: Diagnosis not present

## 2017-09-22 DIAGNOSIS — R030 Elevated blood-pressure reading, without diagnosis of hypertension: Secondary | ICD-10-CM | POA: Insufficient documentation

## 2017-09-22 DIAGNOSIS — I10 Essential (primary) hypertension: Secondary | ICD-10-CM | POA: Diagnosis not present

## 2017-09-22 LAB — BASIC METABOLIC PANEL
Anion gap: 11 (ref 5–15)
BUN: 15 mg/dL (ref 6–20)
CALCIUM: 9.2 mg/dL (ref 8.9–10.3)
CHLORIDE: 105 mmol/L (ref 101–111)
CO2: 22 mmol/L (ref 22–32)
Creatinine, Ser: 1.2 mg/dL (ref 0.61–1.24)
GFR calc Af Amer: >60 mL/min (ref >60)
GFR calc non Af Amer: >60 mL/min (ref >60)
GLUCOSE: 127 mg/dL — AB (ref 65–99)
Potassium: 3.6 mmol/L (ref 3.5–5.1)
Sodium: 138 mmol/L (ref 135–145)

## 2017-09-22 LAB — CBC
HCT: 44.3 % (ref 39.0–52.0)
Hemoglobin: 14.6 g/dL (ref 13.0–17.0)
MCH: 27.9 pg (ref 26.0–34.0)
MCHC: 33 g/dL (ref 30.0–36.0)
MCV: 84.5 fL (ref 78.0–100.0)
PLATELETS: 234 10*3/uL (ref 150–400)
RBC: 5.24 MIL/uL (ref 4.22–5.81)
RDW: 13.4 % (ref 11.5–15.5)
WBC: 10.2 10*3/uL (ref 4.0–10.5)

## 2017-09-22 LAB — I-STAT TROPONIN, ED: Troponin i, poc: 0 ng/mL (ref 0.00–0.08)

## 2017-09-22 MED ORDER — AMLODIPINE BESYLATE 5 MG PO TABS
5.0000 mg | ORAL_TABLET | Freq: Once | ORAL | Status: AC
  Administered 2017-09-23: 5 mg via ORAL
  Filled 2017-09-22: qty 1

## 2017-09-22 NOTE — ED Notes (Signed)
Patient refused to have X RAY done

## 2017-09-22 NOTE — ED Triage Notes (Addendum)
Patient from home with hypertension, had nuclear stress test done yesterday.  Took BP on home device yesterday.  Normally is around 626 systolic today been in the 170s-180s.  Denies any blurred vision or headaches.  No chest pain at this time.  Did have chest pain earlier today.

## 2017-09-23 MED ORDER — AMLODIPINE BESYLATE 5 MG PO TABS: 5.0000 mg | ORAL_TABLET | Freq: Every day | ORAL | 0 refills | Status: AC

## 2017-09-24 NOTE — ED Provider Notes (Signed)
[place] EMERGENCY DEPARTMENT Provider Note   CSN: [id number] Arrival date & time: 09/22/17  1702     History   Chief Complaint Chief Complaint  Patient presents with  . Hypertension    179/110    HPI Bradley Miranda is a 57 y.o. male.  HPI Patient is a 57 year old male presents the emergency department today with complaints of elevated blood pressure at home.  Patient had a normal day today.  He is gone to the gym in the morning and then worked out in the yard for several hours in the afternoon.  He had had a shower and shortly after his shower he began to feel lightheaded.  He had no preceding chest pain or palpitations.  He took his blood pressure at home about his blood pressure should be elevated into the 962E and 366Q systolic.  He continued to check his blood pressure at home and continue to rise and this concerned him.  He came to the ER for evaluation of his elevated blood pressure.  Currently he has no chest discomfort or chest pain.  He is not lightheaded.  He feels normal.  He has had no black or bloody stools.  No history of anemia or blood transfusion before in the past.  Denies abdominal pain.  No nausea vomiting or diarrhea.  He is currently not on any blood pressure medications.  He states his blood pressures consistently elevated and his primary care physician's office but seems to be improving thus he has not been started on blood pressure medication.  This gives him some concern   Past Medical History:  Diagnosis Date  . Basal cell cancer    right arm  . Shoulder pain, bilateral     There are no active problems to display for this patient.   Past Surgical History:  Procedure Laterality Date  . BASAL CELL CARCINOMA EXCISION          Home Medications    Prior to Admission medications   Medication Sig Start Date End Date Taking? Authorizing Provider  amLODipine (NORVASC) 5 MG tablet Take 1 tablet (5 mg total) by mouth daily.  09/23/17   Jola Schmidt, MD  amoxicillin-clavulanate (AUGMENTIN) 875-125 MG tablet amoxicillin 875 mg-potassium clavulanate 125 mg tablet  Take 1 tablet every 12 hours by oral route for 14 days.    [provider]  pantoprazole (PROTONIX) 40 MG tablet Take 1 tablet (40 mg total) by mouth daily. 08/10/17   Lajean Saver, MD    Family History Family History  Problem Relation Age of Onset  . Cancer Mother        breast  . Stomach cancer Father   . Colon cancer Neg Hx   . Esophageal cancer Neg Hx   . Rectal cancer Neg Hx     Social History Social History   Tobacco Use  . Smoking status: Never Smoker  . Smokeless tobacco: Never Used  Substance Use Topics  . Alcohol use: Yes    Comment: occasional  . Drug use: No     Allergies   Patient has no known allergies.   Review of Systems Review of Systems  All other systems reviewed and are negative.    Physical Exam Updated Vital Signs BP (!) 155/95   Pulse 67   Temp 97.7 F (36.5 C) (Oral)   Resp 16   Ht 6\' 1"  (1.854 m)   Wt 111.1 kg (245 lb)   SpO2 94%  BMI 32.32 kg/m   Physical Exam  Constitutional: He is oriented to person, place, and time. He appears well-developed and well-nourished.  HENT:  Head: Normocephalic and atraumatic.  Eyes: EOM are normal.  Neck: Normal range of motion.  Cardiovascular: Normal rate, regular rhythm, normal heart sounds and intact distal pulses.  Pulmonary/Chest: Effort normal and breath sounds normal. No respiratory distress.  Abdominal: Soft. He exhibits no distension. There is no tenderness.  Musculoskeletal: Normal range of motion.  Neurological: He is alert and oriented to person, place, and time.  Skin: Skin is warm and dry.  Psychiatric: He has a normal mood and affect. Judgment normal.  Nursing note and vitals reviewed.    ED Treatments / Results  Labs (all labs ordered are listed, but only abnormal results are displayed) Labs Reviewed  BASIC METABOLIC PANEL -  Abnormal; Notable for the following components:      Result Value   Glucose, Bld 127 (*)    All other components within normal limits  CBC  I-STAT TROPONIN, ED    EKG EKG Interpretation  Date/Time:  Friday September 22 2017 18:19:17 EDT Ventricular Rate:  70 PR Interval:  150 QRS Duration: 106 QT Interval:  400 QTC Calculation: 432 R Axis:   43 Text Interpretation:  Normal sinus rhythm Normal ECG No significant change since last tracing Confirmed by Deno Etienne [phone]) on 09/23/2017 1:49:35 PM   Radiology No results found.  Procedures Procedures (including critical care time)  Medications Ordered in ED Medications  amLODipine (NORVASC) tablet 5 mg (5 mg Oral Given 09/23/17 0028)     Initial Impression / Assessment and Plan / ED Course  I have reviewed the triage vital signs and the nursing notes.  Pertinent labs & imaging results that were available during my care of the patient were reviewed by me and considered in my medical decision making (see chart for details).     Labs reviewed: Specifically hemoglobin 14.6 Other labs within normal limits  Old records reviewed including ER visit on August 10, 2017  Patient is well-appearing at this time.  No signs to suggest acute coronary syndrome.  Elevated blood pressure consistently in the primary care doctor's office and can elevate at home.  Some of this may have represented volume depletion and thus he was given IV fluids here in the emergency department.  He did work outside and go to [employer] and this did occur shortly after a shower.  Hemoglobin stable.  No indication for additional workup or treatment in the emergency department.  Patient will be started on Norvasc.  Primary care follow-up for blood pressure recheck.  He will check his blood pressure twice a day and keep this in a journal for his primary care physician as it is best to manage blood pressure with ambulatory blood pressures   Final Clinical Impressions(s) / ED  Diagnoses   Final diagnoses:  Elevated blood pressure reading    ED Discharge Orders        Ordered    amLODipine (NORVASC) 5 MG tablet  Daily     09/23/17 Orson Aloe, MD 09/24/17 [phone]

## 2017-09-25 DIAGNOSIS — K219 Gastro-esophageal reflux disease without esophagitis: Secondary | ICD-10-CM | POA: Diagnosis not present

## 2017-10-02 ENCOUNTER — Other Ambulatory Visit: Payer: Self-pay | Admitting: Gastroenterology

## 2017-10-02 ENCOUNTER — Ambulatory Visit: Payer: BLUE CROSS/BLUE SHIELD | Admitting: Gastroenterology

## 2017-10-02 DIAGNOSIS — H524 Presbyopia: Secondary | ICD-10-CM | POA: Diagnosis not present

## 2017-10-02 DIAGNOSIS — R1011 Right upper quadrant pain: Secondary | ICD-10-CM | POA: Diagnosis not present

## 2017-10-02 DIAGNOSIS — R1012 Left upper quadrant pain: Secondary | ICD-10-CM | POA: Diagnosis not present

## 2017-10-05 ENCOUNTER — Ambulatory Visit (INDEPENDENT_AMBULATORY_CARE_PROVIDER_SITE_OTHER): Payer: BLUE CROSS/BLUE SHIELD | Admitting: Neurology

## 2017-10-05 DIAGNOSIS — R351 Nocturia: Secondary | ICD-10-CM

## 2017-10-05 DIAGNOSIS — E669 Obesity, unspecified: Secondary | ICD-10-CM

## 2017-10-05 DIAGNOSIS — K219 Gastro-esophageal reflux disease without esophagitis: Secondary | ICD-10-CM

## 2017-10-05 DIAGNOSIS — R0683 Snoring: Secondary | ICD-10-CM

## 2017-10-05 DIAGNOSIS — R0789 Other chest pain: Secondary | ICD-10-CM

## 2017-10-05 DIAGNOSIS — G4733 Obstructive sleep apnea (adult) (pediatric): Secondary | ICD-10-CM

## 2017-10-05 DIAGNOSIS — G4720 Circadian rhythm sleep disorder, unspecified type: Secondary | ICD-10-CM

## 2017-10-06 DIAGNOSIS — I10 Essential (primary) hypertension: Secondary | ICD-10-CM | POA: Diagnosis not present

## 2017-10-06 DIAGNOSIS — Z6833 Body mass index (BMI) 33.0-33.9, adult: Secondary | ICD-10-CM | POA: Diagnosis not present

## 2017-10-09 ENCOUNTER — Telehealth: Payer: Self-pay

## 2017-10-09 NOTE — Telephone Encounter (Signed)
-----   Message from Star Age, MD sent at 10/09/2017  7:44 AM EDT ----- Patient referred by Dr. Ardeth Perfect, seen by me on 08/24/17, diagnostic PSG on 10/05/17.    Please call and notify the patient that the recent sleep study did confirm the diagnosis of obstructive sleep apnea. OSA is overall mild, but worth treating to see if he feels better after treatment. To that end I recommend treatment for this in the form of autoPAP, which means, that we don't have to bring him back for a second sleep study with CPAP, but will let him try an autoPAP machine at home, through a DME company (of his choice, or as per insurance requirement). The DME representative will educate him on how to use the machine, how to put the mask on, etc. I have placed an order in the chart. Please send referral, talk to patient, send report to referring MD. We will need a FU in sleep clinic for 10 weeks post-PAP set up, please arrange that with me or one of our NPs. Thanks,   Star Age, MD, PhD [facility])

## 2017-10-09 NOTE — Procedures (Signed)
PATIENT'S NAME:  Bradley Miranda, Bradley Miranda DOB:      Oct 25, 1960      MR#:    [id number]     DATE OF RECORDING: 10/05/2017 REFERRING M.D.:  Velna Hatchet, MD Study Performed:   Baseline Polysomnogram HISTORY: 57 year old man with a history of neck pain, history of degenerative disc disease, shoulder pain, reflux disease, atypical chest pain, and obesity, who reports snoring and recent elevation of his blood pressure values. The patient endorsed the Epworth Sleepiness Scale at 4/24 points. The patient's weight 253 pounds with a height of 72 (inches), resulting in a BMI of 34.3 kg/m2. The patient's neck circumference measured 19 inches.  CURRENT MEDICATIONS: Augmentin, Protonix   PROCEDURE:  This is a multichannel digital polysomnogram utilizing the Somnostar 11.2 system.  Electrodes and sensors were applied and monitored per AASM Specifications.   EEG, EOG, Chin and Limb EMG, were sampled at 200 Hz.  ECG, Snore and Nasal Pressure, Thermal Airflow, Respiratory Effort, CPAP Flow and Pressure, Oximetry was sampled at 50 Hz. Digital video and audio were recorded.      BASELINE STUDY  Lights Out was at 21:11 and Lights On at 03:14.  Total recording time (TRT) was 363.5 minutes, with a total sleep time (TST) of 171.5 minutes. The patient's sleep latency was 169 minutes, which is markedly delayed.  REM latency was 263 minutes, which is markedly delayed. The sleep efficiency was 47.2%, which is markedly reduced.     SLEEP ARCHITECTURE: WASO (Wake after sleep onset) was 167.5 minutes with severe sleep fragmentation noted. There were 15 minutes in Stage N1, 152 minutes Stage N2, 0 minutes Stage N3 and 4.5 minutes in Stage REM.  The percentage of Stage N1 was 8.7%, which is increased, Stage N2 was 88.6%, which was markedly increased, Stage N3 was absent, and Stage R (REM sleep) was nearly absent at 2.6%. The arousals were noted as: 24 were spontaneous, 0 were associated with PLMs, 7 were associated with respiratory  events.  RESPIRATORY ANALYSIS:  There were a total of 20 respiratory events:  4 obstructive apneas, 0 central apneas and 0 mixed apneas with a total of 4 apneas and an apnea index (AI) of 1.4 /hour. There were 16 hypopneas with a hypopnea index of 5.6 /hour. The patient also had 10 respiratory event related arousals (RERAs).      The total APNEA/HYPOPNEA INDEX (AHI) was 7.0./hour and the total RESPIRATORY DISTURBANCE INDEX was 0. 10.5 /hour.  4 events occurred in REM sleep and 24 events in NREM. The REM AHI was 53.3/hour, versus a non-REM AHI of 5.7. The patient spent 19 minutes of total sleep time in the supine position and 153 minutes in non-supine. The supine AHI was 15.8 versus a non-supine AHI of 5.9.  OXYGEN SATURATION & C02:  The Wake baseline 02 saturation was 96%, with the lowest being 83%. Time spent below 89% saturation equaled 3 minutes. PERIODIC LIMB MOVEMENTS: The patient had a total of 0 Periodic Limb Movements.  The Periodic Limb Movement (PLM) index was 0 and the PLM Arousal index was 0/hour.  Audio and video analysis did not show any abnormal or unusual movements, behaviors, phonations or vocalizations. The patient took 3 bathroom breaks. Mild to moderate snoring was noted. The EKG was in keeping with normal sinus rhythm (NSR).  Post-study, the patient indicated that sleep was worse than usual.   IMPRESSION:  1. Obstructive Sleep Apnea (OSA) 2. Dysfunctions associated with sleep stages or arousal from sleep  RECOMMENDATIONS:  1.  This study demonstrates overall mild obstructive sleep apnea, more pronounced in during supine and in REM sleep with a total AHI of 7/hour, REM AHI of 53.3/hour, supine AHI of 15.8/hour, and O2 nadir of 83%. Given the patient's medical history and sleep related complaints, treatment with positive airway pressure is recommended; this can be achieved in the form of autoPAP. Alternatively, a full-night CPAP titration study would allow optimization of  therapy if needed. Other treatment options may include avoidance of supine sleep position along with weight loss, upper airway or jaw surgery in selected patients or the use of an oral appliance in certain patients. ENT evaluation and/or consultation with a maxillofacial surgeon or dentist may be feasible in some instances.    2. Please note that untreated obstructive sleep apnea may carry additional perioperative morbidity. Patients with significant obstructive sleep apnea should receive perioperative PAP therapy and the surgeons and particularly the anesthesiologist should be informed of the diagnosis and the severity of the sleep disordered breathing. 3. This study shows significant sleep fragmentation and abnormal sleep stage percentages; these are nonspecific findings and per se do not signify an intrinsic sleep disorder or a cause for the patient's sleep-related symptoms. Causes include (but are not limited to) the first night effect of the sleep study, circadian rhythm disturbances, medication effect or an underlying mood disorder or medical problem.  4. The patient should be cautioned not to drive, work at heights, or operate dangerous or heavy equipment when tired or sleepy. Review and reiteration of good sleep hygiene measures should be pursued with any patient. 5. The patient will be seen in follow-up by Dr. Rexene Alberts at [facility] for discussion of the test results and further management strategies. The referring provider will be notified of the test results.  I certify that I have reviewed the entire raw data recording prior to the issuance of this report in accordance with the Standards of Accreditation of the American Academy of Sleep Medicine (AASM)   Star Age, MD, PhD Diplomat, American Board of Psychiatry and Neurology (Neurology and Sleep Medicine)

## 2017-10-09 NOTE — Telephone Encounter (Signed)
I called pt. I advised pt that Dr. Rexene Alberts reviewed their sleep study results and found that pt has mild but it is enough that it is worth treating to see if he feels better. Dr. Rexene Alberts recommends that pt start an auto pap at home. I reviewed PAP compliance expectations with the pt. Pt is agreeable to starting an auto-PAP. I advised pt that an order will be sent to a DME, [facility], and [facility] will call the pt within about one week after they file with the pt's insurance. [facility] will show the pt how to use the machine, fit for masks, and troubleshoot the auto-PAP if needed. A follow up appt was made for insurance purposes with Dr. Rexene Alberts on 01/15/18 at 1:00pm. Pt verbalized understanding to arrive 15 minutes early and bring their auto-PAP. A letter with all of this information in it will be mailed to the pt as a reminder. I verified with the pt that the address we have on file is correct. Pt verbalized understanding of results. Pt had no questions at this time but was encouraged to call back if questions arise.

## 2017-10-09 NOTE — Progress Notes (Signed)
Patient referred by Dr. Ardeth Perfect, seen by me on 08/24/17, diagnostic PSG on 10/05/17.    Please call and notify the patient that the recent sleep study did confirm the diagnosis of obstructive sleep apnea. OSA is overall mild, but worth treating to see if he feels better after treatment. To that end I recommend treatment for this in the form of autoPAP, which means, that we don't have to bring him back for a second sleep study with CPAP, but will let him try an autoPAP machine at home, through a DME company (of his choice, or as per insurance requirement). The DME representative will educate him on how to use the machine, how to put the mask on, etc. I have placed an order in the chart. Please send referral, talk to patient, send report to referring MD. We will need a FU in sleep clinic for 10 weeks post-PAP set up, please arrange that with me or one of our NPs. Thanks,   Star Age, MD, PhD [facility])

## 2017-10-09 NOTE — Addendum Note (Signed)
Addended by: Star Age on: 10/09/2017 07:44 AM   Modules accepted: Orders

## 2017-10-13 ENCOUNTER — Encounter (HOSPITAL_COMMUNITY)
Admission: RE | Admit: 2017-10-13 | Discharge: 2017-10-13 | Disposition: A | Discharge status: Home / Self Care | Payer: BLUE CROSS/BLUE SHIELD | Source: Ambulatory Visit | Attending: Gastroenterology | Admitting: Gastroenterology

## 2017-10-13 DIAGNOSIS — R1011 Right upper quadrant pain: Secondary | ICD-10-CM | POA: Insufficient documentation

## 2017-10-13 MED ORDER — TECHNETIUM TC 99M MEBROFENIN IV KIT
5.0000 | PACK | Freq: Once | INTRAVENOUS | Status: AC | PRN
  Administered 2017-10-13: 5 via INTRAVENOUS

## 2017-10-24 DIAGNOSIS — H5203 Hypermetropia, bilateral: Secondary | ICD-10-CM | POA: Diagnosis not present

## 2017-11-01 NOTE — Telephone Encounter (Signed)
I called pt and explained Dr. Guadelupe Sabin recommendations regarding his auto pap and osa treatment. Pt declined an auto pap and a follow up appt with Dr. Rexene Alberts. He reports that his BP "has come down." I asked pt to call us back with any further questions or concerns. Pt verbalized understanding.

## 2017-11-01 NOTE — Telephone Encounter (Signed)
Received notice from [facility] that pt has cancelled his cpap order.

## 2017-11-01 NOTE — Telephone Encounter (Signed)
Please call pt to offer FU appt to discuss autoPAP and alternative treatments. One reason to treat his OSA with autoPAP is to see if his BP numbers improve.

## 2017-12-02 DIAGNOSIS — E669 Obesity, unspecified: Secondary | ICD-10-CM | POA: Diagnosis not present

## 2017-12-02 DIAGNOSIS — M542 Cervicalgia: Secondary | ICD-10-CM | POA: Diagnosis not present

## 2017-12-02 DIAGNOSIS — I10 Essential (primary) hypertension: Secondary | ICD-10-CM | POA: Diagnosis not present

## 2017-12-02 DIAGNOSIS — G4730 Sleep apnea, unspecified: Secondary | ICD-10-CM | POA: Diagnosis not present

## 2017-12-02 DIAGNOSIS — Z23 Encounter for immunization: Secondary | ICD-10-CM | POA: Diagnosis not present

## 2017-12-15 DIAGNOSIS — H524 Presbyopia: Secondary | ICD-10-CM | POA: Diagnosis not present

## 2017-12-25 DIAGNOSIS — R109 Unspecified abdominal pain: Secondary | ICD-10-CM | POA: Diagnosis not present

## 2017-12-26 DIAGNOSIS — Q631 Lobulated, fused and horseshoe kidney: Secondary | ICD-10-CM | POA: Diagnosis not present

## 2017-12-26 DIAGNOSIS — N130 Hydronephrosis with ureteropelvic junction obstruction: Secondary | ICD-10-CM | POA: Diagnosis not present

## 2017-12-29 DIAGNOSIS — M4802 Spinal stenosis, cervical region: Secondary | ICD-10-CM | POA: Diagnosis not present

## 2018-01-10 ENCOUNTER — Telehealth: Payer: Self-pay | Admitting: Neurology

## 2018-01-10 NOTE — Telephone Encounter (Signed)
He can keep appt or make another appt if he would like to consider alternative Rx for OSA, such as oral appliance.   For mild sleep apnea, weight loss and avoiding the supine sleep position may also be a treatment option. He is welcome to keep his appointment if he would like to go over the details of his sleep study and talk about alternative treatment options, alternatively, I can also make a referral to dentistry if he chooses to go that route. Please call pt back.

## 2018-01-10 NOTE — Telephone Encounter (Signed)
Pt c/a appt on 8/12. Pt said he declined to use bipap.  FYI

## 2018-01-11 NOTE — Telephone Encounter (Signed)
I called and spoke with patient and made him aware of Dr. Guadelupe Sabin alternative recommendations. He voiced understanding and appreciation and said that he would talk to his dentist about it at his next visit.

## 2018-01-15 ENCOUNTER — Ambulatory Visit: Payer: Self-pay | Admitting: Neurology

## 2018-02-09 DIAGNOSIS — D229 Melanocytic nevi, unspecified: Secondary | ICD-10-CM | POA: Diagnosis not present

## 2018-02-09 DIAGNOSIS — L821 Other seborrheic keratosis: Secondary | ICD-10-CM | POA: Diagnosis not present

## 2018-02-09 DIAGNOSIS — L578 Other skin changes due to chronic exposure to nonionizing radiation: Secondary | ICD-10-CM | POA: Diagnosis not present

## 2018-02-09 DIAGNOSIS — L570 Actinic keratosis: Secondary | ICD-10-CM | POA: Diagnosis not present

## 2018-02-09 DIAGNOSIS — D1801 Hemangioma of skin and subcutaneous tissue: Secondary | ICD-10-CM | POA: Diagnosis not present

## 2018-04-06 DIAGNOSIS — M4722 Other spondylosis with radiculopathy, cervical region: Secondary | ICD-10-CM | POA: Diagnosis not present

## 2018-05-18 DIAGNOSIS — M4722 Other spondylosis with radiculopathy, cervical region: Secondary | ICD-10-CM | POA: Diagnosis not present

## 2018-09-21 DIAGNOSIS — Z125 Encounter for screening for malignant neoplasm of prostate: Secondary | ICD-10-CM | POA: Diagnosis not present

## 2018-09-21 DIAGNOSIS — Z0000 Encounter for general adult medical examination without abnormal findings: Secondary | ICD-10-CM | POA: Diagnosis not present

## 2018-09-21 DIAGNOSIS — I10 Essential (primary) hypertension: Secondary | ICD-10-CM | POA: Diagnosis not present

## 2018-09-21 DIAGNOSIS — R82998 Other abnormal findings in urine: Secondary | ICD-10-CM | POA: Diagnosis not present

## 2018-09-26 DIAGNOSIS — K219 Gastro-esophageal reflux disease without esophagitis: Secondary | ICD-10-CM | POA: Diagnosis not present

## 2018-09-26 DIAGNOSIS — Z0000 Encounter for general adult medical examination without abnormal findings: Secondary | ICD-10-CM | POA: Diagnosis not present

## 2018-09-26 DIAGNOSIS — R0789 Other chest pain: Secondary | ICD-10-CM | POA: Diagnosis not present

## 2018-09-26 DIAGNOSIS — Z1331 Encounter for screening for depression: Secondary | ICD-10-CM | POA: Diagnosis not present

## 2018-09-26 DIAGNOSIS — M5412 Radiculopathy, cervical region: Secondary | ICD-10-CM | POA: Diagnosis not present

## 2019-02-12 DIAGNOSIS — L570 Actinic keratosis: Secondary | ICD-10-CM | POA: Diagnosis not present

## 2019-02-12 DIAGNOSIS — L819 Disorder of pigmentation, unspecified: Secondary | ICD-10-CM | POA: Diagnosis not present

## 2019-02-12 DIAGNOSIS — L814 Other melanin hyperpigmentation: Secondary | ICD-10-CM | POA: Diagnosis not present

## 2019-02-12 DIAGNOSIS — D1801 Hemangioma of skin and subcutaneous tissue: Secondary | ICD-10-CM | POA: Diagnosis not present

## 2019-02-12 DIAGNOSIS — L821 Other seborrheic keratosis: Secondary | ICD-10-CM | POA: Diagnosis not present

## 2019-03-09 DIAGNOSIS — M4802 Spinal stenosis, cervical region: Secondary | ICD-10-CM | POA: Diagnosis not present

## 2019-03-09 DIAGNOSIS — Z85828 Personal history of other malignant neoplasm of skin: Secondary | ICD-10-CM | POA: Diagnosis not present

## 2019-03-09 DIAGNOSIS — I10 Essential (primary) hypertension: Secondary | ICD-10-CM | POA: Diagnosis not present

## 2019-08-15 DIAGNOSIS — Z23 Encounter for immunization: Secondary | ICD-10-CM | POA: Diagnosis not present

## 2019-09-27 DIAGNOSIS — E669 Obesity, unspecified: Secondary | ICD-10-CM | POA: Diagnosis not present

## 2019-09-27 DIAGNOSIS — Z0000 Encounter for general adult medical examination without abnormal findings: Secondary | ICD-10-CM | POA: Diagnosis not present

## 2019-09-27 DIAGNOSIS — I10 Essential (primary) hypertension: Secondary | ICD-10-CM | POA: Diagnosis not present

## 2019-09-27 DIAGNOSIS — Z79899 Other long term (current) drug therapy: Secondary | ICD-10-CM | POA: Diagnosis not present

## 2019-09-27 DIAGNOSIS — Z125 Encounter for screening for malignant neoplasm of prostate: Secondary | ICD-10-CM | POA: Diagnosis not present

## 2019-09-30 DIAGNOSIS — R82998 Other abnormal findings in urine: Secondary | ICD-10-CM | POA: Diagnosis not present

## 2019-09-30 DIAGNOSIS — I10 Essential (primary) hypertension: Secondary | ICD-10-CM | POA: Diagnosis not present

## 2019-10-01 DIAGNOSIS — Z1212 Encounter for screening for malignant neoplasm of rectum: Secondary | ICD-10-CM | POA: Diagnosis not present

## 2019-10-04 DIAGNOSIS — Z0000 Encounter for general adult medical examination without abnormal findings: Secondary | ICD-10-CM | POA: Diagnosis not present

## 2019-10-04 DIAGNOSIS — Z1331 Encounter for screening for depression: Secondary | ICD-10-CM | POA: Diagnosis not present

## 2019-10-04 DIAGNOSIS — K219 Gastro-esophageal reflux disease without esophagitis: Secondary | ICD-10-CM | POA: Diagnosis not present

## 2019-10-04 DIAGNOSIS — E669 Obesity, unspecified: Secondary | ICD-10-CM | POA: Diagnosis not present

## 2019-10-04 DIAGNOSIS — G4733 Obstructive sleep apnea (adult) (pediatric): Secondary | ICD-10-CM | POA: Diagnosis not present

## 2019-10-04 DIAGNOSIS — I10 Essential (primary) hypertension: Secondary | ICD-10-CM | POA: Diagnosis not present

## 2019-10-11 DIAGNOSIS — I10 Essential (primary) hypertension: Secondary | ICD-10-CM | POA: Diagnosis not present

## 2019-10-11 DIAGNOSIS — Z85828 Personal history of other malignant neoplasm of skin: Secondary | ICD-10-CM | POA: Diagnosis not present

## 2019-10-11 DIAGNOSIS — M4802 Spinal stenosis, cervical region: Secondary | ICD-10-CM | POA: Diagnosis not present

## 2019-10-11 DIAGNOSIS — Z23 Encounter for immunization: Secondary | ICD-10-CM | POA: Diagnosis not present

## 2019-11-08 DIAGNOSIS — J342 Deviated nasal septum: Secondary | ICD-10-CM | POA: Diagnosis not present

## 2019-11-08 DIAGNOSIS — J343 Hypertrophy of nasal turbinates: Secondary | ICD-10-CM | POA: Diagnosis not present

## 2019-11-08 DIAGNOSIS — J310 Chronic rhinitis: Secondary | ICD-10-CM | POA: Diagnosis not present

## 2019-11-11 ENCOUNTER — Other Ambulatory Visit: Payer: Self-pay | Admitting: Otolaryngology

## 2019-11-18 DIAGNOSIS — B070 Plantar wart: Secondary | ICD-10-CM | POA: Diagnosis not present

## 2019-11-22 ENCOUNTER — Encounter (HOSPITAL_BASED_OUTPATIENT_CLINIC_OR_DEPARTMENT_OTHER): Payer: Self-pay | Admitting: Otolaryngology

## 2019-11-22 ENCOUNTER — Other Ambulatory Visit: Payer: Self-pay

## 2019-11-28 ENCOUNTER — Other Ambulatory Visit (HOSPITAL_COMMUNITY)
Admission: RE | Admit: 2019-11-28 | Discharge: 2019-11-28 | Disposition: A | Discharge status: Home / Self Care | Payer: BC Managed Care – PPO | Source: Ambulatory Visit | Attending: Otolaryngology | Admitting: Otolaryngology

## 2019-11-28 ENCOUNTER — Encounter (HOSPITAL_BASED_OUTPATIENT_CLINIC_OR_DEPARTMENT_OTHER)
Admission: RE | Admit: 2019-11-28 | Discharge: 2019-11-28 | Disposition: A | Discharge status: Home / Self Care | Payer: BC Managed Care – PPO | Source: Ambulatory Visit | Attending: Otolaryngology | Admitting: Otolaryngology

## 2019-11-28 DIAGNOSIS — J3489 Other specified disorders of nose and nasal sinuses: Secondary | ICD-10-CM | POA: Diagnosis not present

## 2019-11-28 DIAGNOSIS — Z20822 Contact with and (suspected) exposure to covid-19: Secondary | ICD-10-CM | POA: Diagnosis not present

## 2019-11-28 DIAGNOSIS — Z01812 Encounter for preprocedural laboratory examination: Secondary | ICD-10-CM | POA: Insufficient documentation

## 2019-11-28 DIAGNOSIS — I10 Essential (primary) hypertension: Secondary | ICD-10-CM | POA: Diagnosis not present

## 2019-11-28 DIAGNOSIS — M4802 Spinal stenosis, cervical region: Secondary | ICD-10-CM | POA: Diagnosis not present

## 2019-11-28 DIAGNOSIS — E669 Obesity, unspecified: Secondary | ICD-10-CM | POA: Diagnosis not present

## 2019-11-28 DIAGNOSIS — Z79899 Other long term (current) drug therapy: Secondary | ICD-10-CM | POA: Diagnosis not present

## 2019-11-28 DIAGNOSIS — Z6835 Body mass index (BMI) 35.0-35.9, adult: Secondary | ICD-10-CM | POA: Diagnosis not present

## 2019-11-28 DIAGNOSIS — J343 Hypertrophy of nasal turbinates: Secondary | ICD-10-CM | POA: Diagnosis not present

## 2019-11-28 DIAGNOSIS — J342 Deviated nasal septum: Secondary | ICD-10-CM | POA: Diagnosis not present

## 2019-11-28 LAB — SARS CORONAVIRUS 2 (TAT 6-24 HRS): SARS Coronavirus 2: NEGATIVE

## 2019-12-02 ENCOUNTER — Other Ambulatory Visit: Payer: Self-pay

## 2019-12-02 ENCOUNTER — Encounter (HOSPITAL_BASED_OUTPATIENT_CLINIC_OR_DEPARTMENT_OTHER)
Admission: RE | Disposition: A | Discharge status: Home / Self Care | Payer: Self-pay | Source: Home / Self Care | Attending: Otolaryngology

## 2019-12-02 ENCOUNTER — Ambulatory Visit (HOSPITAL_BASED_OUTPATIENT_CLINIC_OR_DEPARTMENT_OTHER): Payer: BC Managed Care – PPO | Admitting: Anesthesiology

## 2019-12-02 ENCOUNTER — Ambulatory Visit (HOSPITAL_BASED_OUTPATIENT_CLINIC_OR_DEPARTMENT_OTHER)
Admission: RE | Admit: 2019-12-02 | Discharge: 2019-12-02 | Disposition: A | Discharge status: Home / Self Care | Payer: BC Managed Care – PPO | Attending: Otolaryngology | Admitting: Otolaryngology

## 2019-12-02 ENCOUNTER — Encounter (HOSPITAL_BASED_OUTPATIENT_CLINIC_OR_DEPARTMENT_OTHER): Payer: Self-pay | Admitting: Otolaryngology

## 2019-12-02 DIAGNOSIS — M4802 Spinal stenosis, cervical region: Secondary | ICD-10-CM | POA: Insufficient documentation

## 2019-12-02 DIAGNOSIS — E669 Obesity, unspecified: Secondary | ICD-10-CM | POA: Insufficient documentation

## 2019-12-02 DIAGNOSIS — Z79899 Other long term (current) drug therapy: Secondary | ICD-10-CM | POA: Diagnosis not present

## 2019-12-02 DIAGNOSIS — J343 Hypertrophy of nasal turbinates: Secondary | ICD-10-CM | POA: Insufficient documentation

## 2019-12-02 DIAGNOSIS — I10 Essential (primary) hypertension: Secondary | ICD-10-CM | POA: Insufficient documentation

## 2019-12-02 DIAGNOSIS — Z6835 Body mass index (BMI) 35.0-35.9, adult: Secondary | ICD-10-CM | POA: Insufficient documentation

## 2019-12-02 DIAGNOSIS — J3489 Other specified disorders of nose and nasal sinuses: Secondary | ICD-10-CM | POA: Insufficient documentation

## 2019-12-02 DIAGNOSIS — J342 Deviated nasal septum: Secondary | ICD-10-CM | POA: Insufficient documentation

## 2019-12-02 HISTORY — PX: NASAL SEPTOPLASTY W/ TURBINOPLASTY: SHX2070

## 2019-12-02 HISTORY — DX: Spinal stenosis, cervical region: M48.02

## 2019-12-02 HISTORY — DX: Essential (primary) hypertension: I10

## 2019-12-02 SURGERY — SEPTOPLASTY, NOSE, WITH NASAL TURBINATE REDUCTION
Anesthesia: General | Site: Nose | Laterality: Bilateral

## 2019-12-02 MED ORDER — DEXAMETHASONE SODIUM PHOSPHATE 10 MG/ML IJ SOLN
INTRAMUSCULAR | Status: AC
  Filled 2019-12-02: qty 1

## 2019-12-02 MED ORDER — AMOXICILLIN 875 MG PO TABS: 875.0000 mg | ORAL_TABLET | Freq: Two times a day (BID) | ORAL | 0 refills | Status: AC

## 2019-12-02 MED ORDER — ROCURONIUM BROMIDE 10 MG/ML (PF) SYRINGE
PREFILLED_SYRINGE | INTRAVENOUS | Status: AC
  Filled 2019-12-02: qty 10

## 2019-12-02 MED ORDER — ONDANSETRON HCL 4 MG/2ML IJ SOLN
INTRAMUSCULAR | Status: AC
  Filled 2019-12-02: qty 2

## 2019-12-02 MED ORDER — MUPIROCIN 2 % EX OINT
TOPICAL_OINTMENT | CUTANEOUS | Status: AC
  Filled 2019-12-02: qty 22

## 2019-12-02 MED ORDER — LIDOCAINE 2% (20 MG/ML) 5 ML SYRINGE
INTRAMUSCULAR | Status: AC
  Filled 2019-12-02: qty 5

## 2019-12-02 MED ORDER — SUGAMMADEX SODIUM 500 MG/5ML IV SOLN
INTRAVENOUS | Status: DC | PRN
Start: 2019-12-02 — End: 2019-12-02
  Administered 2019-12-02: 400 mg via INTRAVENOUS

## 2019-12-02 MED ORDER — ONDANSETRON HCL 4 MG/2ML IJ SOLN
INTRAMUSCULAR | Status: DC | PRN
  Administered 2019-12-02: 4 mg via INTRAVENOUS

## 2019-12-02 MED ORDER — OXYMETAZOLINE HCL 0.05 % NA SOLN
NASAL | Status: AC
Start: 2019-12-02 — End: ?
  Filled 2019-12-02: qty 30

## 2019-12-02 MED ORDER — MUPIROCIN CALCIUM 2 % EX CREA
TOPICAL_CREAM | CUTANEOUS | Status: AC
  Filled 2019-12-02: qty 15

## 2019-12-02 MED ORDER — PROMETHAZINE HCL 25 MG/ML IJ SOLN: 6.2500 mg | INTRAMUSCULAR | Status: DC | PRN

## 2019-12-02 MED ORDER — CEFAZOLIN SODIUM-DEXTROSE 2-3 GM-%(50ML) IV SOLR
INTRAVENOUS | Status: DC | PRN
  Administered 2019-12-02: 2 g via INTRAVENOUS

## 2019-12-02 MED ORDER — OXYCODONE HCL 5 MG/5ML PO SOLN: 5.0000 mg | Freq: Once | ORAL | Status: DC | PRN

## 2019-12-02 MED ORDER — FENTANYL CITRATE (PF) 100 MCG/2ML IJ SOLN
25.0000 ug | INTRAMUSCULAR | Status: DC | PRN
  Administered 2019-12-02: 25 ug via INTRAVENOUS

## 2019-12-02 MED ORDER — LIDOCAINE HCL (CARDIAC) PF 100 MG/5ML IV SOSY
PREFILLED_SYRINGE | INTRAVENOUS | Status: DC | PRN
  Administered 2019-12-02: 100 mg via INTRAVENOUS

## 2019-12-02 MED ORDER — ROCURONIUM BROMIDE 100 MG/10ML IV SOLN
INTRAVENOUS | Status: DC | PRN
  Administered 2019-12-02: 50 mg via INTRAVENOUS

## 2019-12-02 MED ORDER — OXYCODONE-ACETAMINOPHEN 5-325 MG PO TABS: 1.0000 | ORAL_TABLET | ORAL | 0 refills | Status: AC | PRN

## 2019-12-02 MED ORDER — FENTANYL CITRATE (PF) 100 MCG/2ML IJ SOLN
INTRAMUSCULAR | Status: DC | PRN
  Administered 2019-12-02: 100 ug via INTRAVENOUS

## 2019-12-02 MED ORDER — FENTANYL CITRATE (PF) 100 MCG/2ML IJ SOLN
INTRAMUSCULAR | Status: AC
  Filled 2019-12-02: qty 2

## 2019-12-02 MED ORDER — OXYMETAZOLINE HCL 0.05 % NA SOLN
NASAL | Status: DC | PRN
  Administered 2019-12-02: 1 via TOPICAL

## 2019-12-02 MED ORDER — SODIUM CHLORIDE 0.9 % IV SOLN
INTRAVENOUS | Status: DC | PRN
  Administered 2019-12-02 (×2): 80 ug via INTRAVENOUS
  Administered 2019-12-02: 120 ug via INTRAVENOUS

## 2019-12-02 MED ORDER — DEXAMETHASONE SODIUM PHOSPHATE 4 MG/ML IJ SOLN
INTRAMUSCULAR | Status: DC | PRN
  Administered 2019-12-02: 10 mg via INTRAVENOUS

## 2019-12-02 MED ORDER — PROPOFOL 10 MG/ML IV BOLUS
INTRAVENOUS | Status: DC | PRN
  Administered 2019-12-02: 200 mg via INTRAVENOUS

## 2019-12-02 MED ORDER — MIDAZOLAM HCL 5 MG/5ML IJ SOLN
INTRAMUSCULAR | Status: DC | PRN
  Administered 2019-12-02: 2 mg via INTRAVENOUS

## 2019-12-02 MED ORDER — PROPOFOL 10 MG/ML IV BOLUS
INTRAVENOUS | Status: AC
  Filled 2019-12-02: qty 20

## 2019-12-02 MED ORDER — LIDOCAINE-EPINEPHRINE 1 %-1:100000 IJ SOLN
INTRAMUSCULAR | Status: AC
  Filled 2019-12-02: qty 1

## 2019-12-02 MED ORDER — MIDAZOLAM HCL 2 MG/2ML IJ SOLN
INTRAMUSCULAR | Status: AC
  Filled 2019-12-02: qty 2

## 2019-12-02 MED ORDER — MUPIROCIN 2 % EX OINT
TOPICAL_OINTMENT | CUTANEOUS | Status: DC | PRN
  Administered 2019-12-02: 1 via NASAL

## 2019-12-02 MED ORDER — OXYCODONE HCL 5 MG PO TABS: 5.0000 mg | ORAL_TABLET | Freq: Once | ORAL | Status: DC | PRN

## 2019-12-02 MED ORDER — LIDOCAINE-EPINEPHRINE 1 %-1:100000 IJ SOLN
INTRAMUSCULAR | Status: DC | PRN
  Administered 2019-12-02: 4 mL

## 2019-12-02 MED ORDER — LACTATED RINGERS IV SOLN: INTRAVENOUS | Status: DC

## 2019-12-02 SURGICAL SUPPLY — 33 items
ATTRACTOMAT 16X20 MAGNETIC DRP (DRAPES) IMPLANT
CANISTER SUCT 1200ML W/VALVE (MISCELLANEOUS) ×3 IMPLANT
COAGULATOR SUCT 8FR VV (MISCELLANEOUS) ×3 IMPLANT
COVER WAND RF STERILE (DRAPES) IMPLANT
DECANTER SPIKE VIAL GLASS SM (MISCELLANEOUS) IMPLANT
DRSG NASOPORE 8CM (GAUZE/BANDAGES/DRESSINGS) ×2 IMPLANT
DRSG TELFA 3X8 NADH (GAUZE/BANDAGES/DRESSINGS) IMPLANT
ELECT REM PT RETURN 9FT ADLT (ELECTROSURGICAL) ×3
ELECTRODE REM PT RTRN 9FT ADLT (ELECTROSURGICAL) ×1 IMPLANT
GLOVE BIO SURGEON STRL SZ7.5 (GLOVE) ×5 IMPLANT
GOWN STRL REUS W/ TWL LRG LVL3 (GOWN DISPOSABLE) ×2 IMPLANT
GOWN STRL REUS W/TWL LRG LVL3 (GOWN DISPOSABLE) ×6
NDL HYPO 25X1 1.5 SAFETY (NEEDLE) ×1 IMPLANT
NEEDLE HYPO 25X1 1.5 SAFETY (NEEDLE) ×3 IMPLANT
NS IRRIG 1000ML POUR BTL (IV SOLUTION) ×3 IMPLANT
PACK ENT DAY SURGERY (CUSTOM PROCEDURE TRAY) ×3 IMPLANT
PAD DRESSING TELFA 3X8 NADH (GAUZE/BANDAGES/DRESSINGS) IMPLANT
SET BASIN DAY SURGERY F.S. (CUSTOM PROCEDURE TRAY) ×3 IMPLANT
SLEEVE SCD COMPRESS KNEE MED (MISCELLANEOUS) ×2 IMPLANT
SOLUTION BUTLER CLEAR DIP (MISCELLANEOUS) ×3 IMPLANT
SPLINT NASAL AIRWAY SILICONE (MISCELLANEOUS) ×3 IMPLANT
SPONGE GAUZE 2X2 8PLY STER LF (GAUZE/BANDAGES/DRESSINGS) ×1
SPONGE GAUZE 2X2 8PLY STRL LF (GAUZE/BANDAGES/DRESSINGS) ×2 IMPLANT
SPONGE NEURO XRAY DETECT 1X3 (DISPOSABLE) ×3 IMPLANT
SUT CHROMIC 4 0 P 3 18 (SUTURE) ×3 IMPLANT
SUT PLAIN 4 0 ~~LOC~~ 1 (SUTURE) ×3 IMPLANT
SUT PROLENE 3 0 PS 2 (SUTURE) ×3 IMPLANT
SUT VIC AB 4-0 P-3 18XBRD (SUTURE) IMPLANT
SUT VIC AB 4-0 P3 18 (SUTURE)
TOWEL GREEN STERILE FF (TOWEL DISPOSABLE) ×3 IMPLANT
TUBE SALEM SUMP 12R W/ARV (TUBING) IMPLANT
TUBE SALEM SUMP 16 FR W/ARV (TUBING) ×3 IMPLANT
YANKAUER SUCT BULB TIP NO VENT (SUCTIONS) ×1 IMPLANT

## 2019-12-02 NOTE — H&P (Signed)
Cc: Chronic nasal obstruction  HPI: The patient is a 59 y/o male who presents today for evaluation of chronic nasal obstruction. The patient has had multiple nasal traumas in the past. He underwent a closed reduction while in the [employer] over 10 years ago. The patient notes significant difficulty breathing through his nose. He has tried steroid nasal sprays in the past with no relief. He has no history of recurrent sinus infections. He denies facial pain or pressure.   The patient's review of systems (constitutional, eyes, ENT, cardiovascular, respiratory, GI, musculoskeletal, skin, neurologic, psychiatric, endocrine, hematologic, allergic) is noted in the ROS questionnaire.  It is reviewed with the patient.   Family health history: No HTN, DM,CAD, hearing loss or bleeding disorder.  Major events: None.  Ongoing medical problems: Hypertension.  Social history: The patient is married. He denies the use of tobacco or illegal drugs. He drinks alcohol once a month.   Exam: General: Communicates without difficulty, well nourished, no acute distress. Head: Normocephalic, no evidence injury, no tenderness, facial buttresses intact without stepoff. Eyes: PERRL, EOMI. No scleral icterus, conjunctivae clear. Neuro: CN II exam reveals vision grossly intact.  No nystagmus at any point of gaze. Ears: Auricles well formed without lesions.  Ear canals are intact without mass or lesion.  No erythema or edema is appreciated.  The TMs are intact without fluid. Nose: External evaluation reveals normal support and skin without lesions.  Dorsum is intact.  Anterior rhinoscopy reveals congested and edematous mucosa over anterior aspect of the inferior turbinates and nasal septum.  No purulence is noted. Middle meatus is not well visualized. Oral:  Oral cavity and oropharynx are intact, symmetric, without erythema or edema.  Mucosa is moist without lesions. Neck: Full range of motion without pain.  There is no significant  lymphadenopathy.  No masses palpable.  Thyroid bed within normal limits to palpation.  Parotid glands and submandibular glands equal bilaterally without mass.  Trachea is midline. Neuro:  CN 2-12 grossly intact. Gait normal. Vestibular: No nystagmus at any point of gaze.   Procedure: Flexible Nasal Endoscopy Description: Risks, benefits, and alternatives of flexible endoscopy were explained to the patient. Specific mention was made of the risk of throat numbness with difficulty swallowing, possible bleeding from the nose and mouth, and pain from the procedure. The patient gave oral consent to proceed.  The flexible scope was inserted into the right nasal cavity. Severe NSD. Endoscopy of the interior nasal cavity, superior, inferior, and middle meatus was performed. The sphenoid-ethmoid recess was examined. Edematous mucosa was noted. No polyp, mass, or lesion was appreciated. Olfactory cleft was clear. Nasopharynx was clear. Turbinates were hypertrophied but without mass. Incomplete response to decongestion. The procedure was repeated on the contralateral side with similar findings. The patient tolerated the procedure well.   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 This results in significant nasal obstruction. No purulent drainage, polyps, or other suspicious mass or lesion is noted on today's nasal endoscopy.  Plan  1. The physical exam and nasal endoscopy findings are reviewed with the patient. 2. Treatment options include conservative management with daily steroid nasal spray versus septoplasty and bilateral inferior turbinate reduction. The risks, benefits, alternatives, and details of the procedure are reviewed with the patient. Questions are invited and answered. 3. The patient is interested in proceeding with the procedure.  We will schedule the procedure in accordance with the patient's schedule.

## 2019-12-02 NOTE — Discharge Instructions (Addendum)

## 2019-12-02 NOTE — Op Note (Signed)
DATE OF PROCEDURE: 12/02/2019  OPERATIVE REPORT   SURGEON: Leta Baptist, MD   PREOPERATIVE DIAGNOSES:  1. Severe nasal septal deviation.  2. Bilateral inferior turbinate hypertrophy.  3. Chronic nasal obstruction.  POSTOPERATIVE DIAGNOSES:  1. Severe nasal septal deviation.  2. Bilateral inferior turbinate hypertrophy.  3. Chronic nasal obstruction.  PROCEDURE PERFORMED:  1. Septoplasty.  2. Bilateral partial inferior turbinate resection.   ANESTHESIA: General endotracheal tube anesthesia.   COMPLICATIONS: None.   ESTIMATED BLOOD LOSS: 100 mL.   INDICATION FOR PROCEDURE: Bradley Miranda is a 60 y.o. male with a history of chronic nasal obstruction. The patient was treated with antihistamine, decongestant, and steroid nasal sprays. However, the patient continued to be symptomatic. On examination, the patient was noted to have bilateral severe inferior turbinate hypertrophy and significant nasal septal deviation, causing significant nasal obstruction. Based on the above findings, the decision was made for the patient to undergo the above-stated procedures. The risks, benefits, alternatives, and details of the procedures were discussed with the patient. Questions were invited and answered. Informed consent was obtained.   DESCRIPTION OF PROCEDURE: The patient was taken to the operating room and placed supine on the operating table. General endotracheal tube anesthesia was administered by the anesthesiologist. The patient was positioned, and prepped and draped in the standard fashion for nasal surgery. Pledgets soaked with Afrin were placed in both nasal cavities for decongestion. The pledgets were subsequently removed.   Examination of the nasal cavity revealed a severe nasal septal deviation. 1% lidocaine with 1:100,000 epinephrine was injected onto the nasal septum bilaterally. A hemitransfixion incision was made on the left side. The mucosal flap was carefully elevated on the left side.  A cartilaginous incision was made 1 cm superior to the caudal margin of the nasal septum. Mucosal flap was also elevated on the right side in the similar fashion. It should be noted that due to the severe septal deviation, the deviated portion of the cartilaginous and bony septum had to be removed in piecemeal fashion. Once the deviated portions were removed, a straight midline septum was achieved. The septum was then quilted with 4-0 plain gut sutures. The hemitransfixion incision was closed with interrupted 4-0 chromic sutures.   The inferior one half of both hypertrophied inferior turbinate was crossclamped with a Kelly clamp. The inferior one half of each inferior turbinate was then resected with a pair of cross cutting scissors. Hemostasis was achieved with a suction cautery device.  Doyle splints were applied to the nasal septum.  The care of the patient was turned over to the anesthesiologist. The patient was awakened from anesthesia without difficulty. The patient was extubated and transferred to the recovery room in good condition.   OPERATIVE FINDINGS: Severe nasal septal deviation and bilateral inferior turbinate hypertrophy.   SPECIMEN: None.   FOLLOWUP CARE: The patient be discharged home once he is awake and alert. The patient will be placed on Percocet p.r.n. pain, and amoxicillin 875 mg p.o. b.i.d. for 3 days. The patient will follow up in my office in 3 days for splint removal.   Erleen Egner Raynelle Bring, MD

## 2019-12-02 NOTE — Transfer of Care (Signed)
Immediate Anesthesia Transfer of Care Note  Patient: Bradley Miranda  Procedure(s) Performed: NASAL SEPTOPLASTY WITH  BILATERAL TURBINATE REDUCTION (Bilateral )  Patient Location: PACU  Anesthesia Type:General  Level of Consciousness: awake, alert  and oriented  Airway & Oxygen Therapy: Patient Spontanous Breathing and Patient connected to face mask oxygen  Post-op Assessment: Report given to RN and Post -op Vital signs reviewed and stable  Post vital signs: Reviewed and stable  Last Vitals:  Vitals Value Taken Time  BP    Temp    Pulse 73 12/02/19 1151  Resp 9 12/02/19 1151  SpO2 96 % 12/02/19 1151  Vitals shown include unvalidated device data.  Last Pain:  Vitals:   12/02/19 0844  TempSrc: Oral  PainSc: 0-No pain         Complications: No complications documented.

## 2019-12-02 NOTE — Anesthesia Preprocedure Evaluation (Addendum)
Anesthesia Evaluation  Patient identified by MRN, date of birth, ID band Patient awake    Reviewed: Allergy & Precautions, NPO status , Patient's Chart, lab work & pertinent test results  History of Anesthesia Complications Negative for: history of anesthetic complications  Airway Mallampati: II  TM Distance: >3 FB Neck ROM: Full    Dental  (+) Dental Advisory Given, Teeth Intact   Pulmonary neg pulmonary ROS,    Pulmonary exam normal        Cardiovascular hypertension, Pt. on medications Normal cardiovascular exam     Neuro/Psych  Cervical spinal stenosis - intermittent bilateral numbness on fingers, burning sensation in chest and arms - mostly resolved since starting gabapentin  negative psych ROS   GI/Hepatic negative GI ROS, Neg liver ROS,   Endo/Other   Obesity   Renal/GU negative Renal ROS     Musculoskeletal negative musculoskeletal ROS (+)   Abdominal   Peds  Hematology negative hematology ROS (+)   Anesthesia Other Findings Covid test negative   Reproductive/Obstetrics                            Anesthesia Physical Anesthesia Plan  ASA: II  Anesthesia Plan: General   Post-op Pain Management:    Induction: Intravenous  PONV Risk Score and Plan: 2 and Treatment may vary due to age or medical condition, Ondansetron, Dexamethasone and Midazolam  Airway Management Planned: Oral ETT  Additional Equipment: None  Intra-op Plan:   Post-operative Plan: Extubation in OR  Informed Consent: I have reviewed the patients History and Physical, chart, labs and discussed the procedure including the risks, benefits and alternatives for the proposed anesthesia with the patient or authorized representative who has indicated his/her understanding and acceptance.     Dental advisory given  Plan Discussed with: CRNA and Anesthesiologist  Anesthesia Plan Comments:         Anesthesia Quick Evaluation

## 2019-12-02 NOTE — Anesthesia Procedure Notes (Signed)
Procedure Name: Intubation Performed by: Verita Lamb, CRNA Pre-anesthesia Checklist: Patient identified, Emergency Drugs available, Suction available and Patient being monitored Patient Re-evaluated:Patient Re-evaluated prior to induction Oxygen Delivery Method: Circle system utilized Preoxygenation: Pre-oxygenation with 100% oxygen Induction Type: IV induction Ventilation: Mask ventilation without difficulty Grade View: Grade I Tube type: Oral Number of attempts: 2 Airway Equipment and Method: Stylet,  Oral airway and Video-laryngoscopy Placement Confirmation: ETT inserted through vocal cords under direct vision,  positive ETCO2,  breath sounds checked- equal and bilateral and CO2 detector Secured at: 24 cm Tube secured with: Tape Dental Injury: Teeth and Oropharynx as per pre-operative assessment  Comments: Smooth iv induction, attempted to intubate with mac 4, grade III view.  Used glidescope with grade I view.  Bbs. +etco2, lips and teeth as preop.

## 2019-12-02 NOTE — Anesthesia Postprocedure Evaluation (Signed)
Anesthesia Post Note  Patient: Bradley Miranda  Procedure(s) Performed: NASAL SEPTOPLASTY WITH  BILATERAL TURBINATE REDUCTION (Bilateral Nose)     Patient location during evaluation: PACU Anesthesia Type: General Level of consciousness: awake and alert Pain management: pain level controlled Vital Signs Assessment: post-procedure vital signs reviewed and stable Respiratory status: spontaneous breathing, nonlabored ventilation and respiratory function stable Cardiovascular status: blood pressure returned to baseline and stable Postop Assessment: no apparent nausea or vomiting Anesthetic complications: no   No complications documented.  Last Vitals:  Vitals:   12/02/19 1200 12/02/19 1237  BP: 135/89 140/84  Pulse: 82 71  Resp: 18 16  Temp:  (!) 36.3 C  SpO2: 96% 97%    Last Pain:  Vitals:   12/02/19 1237  TempSrc: Oral  PainSc: 3                  Lynda Rainwater

## 2019-12-03 ENCOUNTER — Encounter (HOSPITAL_BASED_OUTPATIENT_CLINIC_OR_DEPARTMENT_OTHER): Payer: Self-pay | Admitting: Otolaryngology

## 2019-12-13 DIAGNOSIS — Z23 Encounter for immunization: Secondary | ICD-10-CM | POA: Diagnosis not present

## 2020-01-13 DIAGNOSIS — D485 Neoplasm of uncertain behavior of skin: Secondary | ICD-10-CM | POA: Diagnosis not present

## 2020-02-17 DIAGNOSIS — L821 Other seborrheic keratosis: Secondary | ICD-10-CM | POA: Diagnosis not present

## 2020-02-17 DIAGNOSIS — D1801 Hemangioma of skin and subcutaneous tissue: Secondary | ICD-10-CM | POA: Diagnosis not present

## 2020-02-17 DIAGNOSIS — D229 Melanocytic nevi, unspecified: Secondary | ICD-10-CM | POA: Diagnosis not present

## 2020-02-17 DIAGNOSIS — L570 Actinic keratosis: Secondary | ICD-10-CM | POA: Diagnosis not present

## 2020-02-17 DIAGNOSIS — L905 Scar conditions and fibrosis of skin: Secondary | ICD-10-CM | POA: Diagnosis not present

## 2020-03-20 DIAGNOSIS — H524 Presbyopia: Secondary | ICD-10-CM | POA: Diagnosis not present

## 2020-03-20 DIAGNOSIS — H25093 Other age-related incipient cataract, bilateral: Secondary | ICD-10-CM | POA: Diagnosis not present

## 2020-03-30 DIAGNOSIS — J343 Hypertrophy of nasal turbinates: Secondary | ICD-10-CM | POA: Diagnosis not present

## 2020-03-30 DIAGNOSIS — J310 Chronic rhinitis: Secondary | ICD-10-CM | POA: Diagnosis not present

## 2020-04-06 DIAGNOSIS — H524 Presbyopia: Secondary | ICD-10-CM | POA: Diagnosis not present

## 2020-07-03 DIAGNOSIS — D2239 Melanocytic nevi of other parts of face: Secondary | ICD-10-CM | POA: Diagnosis not present

## 2020-07-03 DIAGNOSIS — L905 Scar conditions and fibrosis of skin: Secondary | ICD-10-CM | POA: Diagnosis not present

## 2020-07-03 DIAGNOSIS — L439 Lichen planus, unspecified: Secondary | ICD-10-CM | POA: Diagnosis not present

## 2020-07-03 DIAGNOSIS — L814 Other melanin hyperpigmentation: Secondary | ICD-10-CM | POA: Diagnosis not present

## 2020-07-03 DIAGNOSIS — Z85828 Personal history of other malignant neoplasm of skin: Secondary | ICD-10-CM | POA: Diagnosis not present

## 2020-07-03 DIAGNOSIS — C44619 Basal cell carcinoma of skin of left upper limb, including shoulder: Secondary | ICD-10-CM | POA: Diagnosis not present

## 2020-07-03 DIAGNOSIS — B079 Viral wart, unspecified: Secondary | ICD-10-CM | POA: Diagnosis not present

## 2020-07-03 DIAGNOSIS — D485 Neoplasm of uncertain behavior of skin: Secondary | ICD-10-CM | POA: Diagnosis not present

## 2020-07-03 DIAGNOSIS — D229 Melanocytic nevi, unspecified: Secondary | ICD-10-CM | POA: Diagnosis not present

## 2020-08-10 DIAGNOSIS — C44619 Basal cell carcinoma of skin of left upper limb, including shoulder: Secondary | ICD-10-CM | POA: Diagnosis not present

## 2020-08-10 DIAGNOSIS — I10 Essential (primary) hypertension: Secondary | ICD-10-CM | POA: Diagnosis not present

## 2020-08-11 ENCOUNTER — Other Ambulatory Visit: Payer: Self-pay | Admitting: Internal Medicine

## 2020-08-11 DIAGNOSIS — R221 Localized swelling, mass and lump, neck: Secondary | ICD-10-CM

## 2020-08-12 DIAGNOSIS — I10 Essential (primary) hypertension: Secondary | ICD-10-CM | POA: Diagnosis not present

## 2020-08-12 DIAGNOSIS — R519 Headache, unspecified: Secondary | ICD-10-CM | POA: Diagnosis not present

## 2020-08-13 ENCOUNTER — Other Ambulatory Visit: Payer: Self-pay

## 2020-08-13 ENCOUNTER — Ambulatory Visit
Admission: RE | Admit: 2020-08-13 | Discharge: 2020-08-13 | Disposition: A | Discharge status: Home / Self Care | Payer: BC Managed Care – PPO | Source: Ambulatory Visit | Attending: Internal Medicine | Admitting: Internal Medicine

## 2020-08-13 ENCOUNTER — Other Ambulatory Visit: Payer: Self-pay | Admitting: Internal Medicine

## 2020-08-13 DIAGNOSIS — R519 Headache, unspecified: Secondary | ICD-10-CM

## 2020-08-13 DIAGNOSIS — M542 Cervicalgia: Secondary | ICD-10-CM

## 2020-08-13 DIAGNOSIS — M4802 Spinal stenosis, cervical region: Secondary | ICD-10-CM | POA: Diagnosis not present

## 2020-08-21 ENCOUNTER — Ambulatory Visit
Admission: RE | Admit: 2020-08-21 | Discharge: 2020-08-21 | Disposition: A | Discharge status: Home / Self Care | Payer: BC Managed Care – PPO | Source: Ambulatory Visit | Attending: Internal Medicine | Admitting: Internal Medicine

## 2020-08-21 DIAGNOSIS — R221 Localized swelling, mass and lump, neck: Secondary | ICD-10-CM

## 2020-08-21 DIAGNOSIS — R220 Localized swelling, mass and lump, head: Secondary | ICD-10-CM | POA: Diagnosis not present

## 2020-10-15 DIAGNOSIS — M4802 Spinal stenosis, cervical region: Secondary | ICD-10-CM | POA: Diagnosis not present

## 2020-10-15 DIAGNOSIS — R600 Localized edema: Secondary | ICD-10-CM | POA: Diagnosis not present

## 2020-10-15 DIAGNOSIS — Z85828 Personal history of other malignant neoplasm of skin: Secondary | ICD-10-CM | POA: Diagnosis not present

## 2020-10-15 DIAGNOSIS — I10 Essential (primary) hypertension: Secondary | ICD-10-CM | POA: Diagnosis not present

## 2020-10-19 DIAGNOSIS — Z125 Encounter for screening for malignant neoplasm of prostate: Secondary | ICD-10-CM | POA: Diagnosis not present

## 2020-10-19 DIAGNOSIS — I10 Essential (primary) hypertension: Secondary | ICD-10-CM | POA: Diagnosis not present

## 2020-10-30 DIAGNOSIS — I10 Essential (primary) hypertension: Secondary | ICD-10-CM | POA: Diagnosis not present

## 2021-07-02 ENCOUNTER — Emergency Department: Admit: 2021-07-02 | Payer: BLUE CROSS/BLUE SHIELD

## 2021-07-02 ENCOUNTER — Inpatient Hospital Stay
Admit: 2021-07-02 | Discharge: 2021-07-03 | Disposition: A | Discharge status: Home / Self Care | Payer: BLUE CROSS/BLUE SHIELD | Attending: Emergency Medicine

## 2021-07-02 DIAGNOSIS — R0789 Other chest pain: Secondary | ICD-10-CM

## 2021-07-02 LAB — COMPREHENSIVE METABOLIC PANEL
ALT: 25 U/L (ref 0–50)
AST: 20 U/L (ref 0–50)
Albumin/Globulin Ratio: 1 (ref 1.00–2.70)
Albumin: 4.2 g/dL (ref 3.5–5.2)
Alk Phosphatase: 119 U/L (ref 40–130)
Anion Gap: 10 mmol/L (ref 2–17)
BUN: 17 mg/dL (ref 6–20)
CO2: 24 mmol/L (ref 22–29)
Calcium: 9.1 mg/dL (ref 8.6–10.0)
Chloride: 104 mmol/L (ref 98–107)
Creatinine: 0.9 mg/dL (ref 0.7–1.3)
Est, Glom Filt Rate: 98 mL/min/1.73m?? (ref >=90)
Globulin: 2.9 g/dL (ref 1.9–4.4)
Glucose: 105 mg/dL — ABNORMAL HIGH (ref 70–99)
Osmolaliy Calculated: 277 mosm/kg (ref 270–287)
Potassium: 3.9 mmol/L (ref 3.5–5.3)
Sodium: 138 mmol/L (ref 135–145)
Total Bilirubin: 1.2 mg/dL (ref 0.00–1.20)
Total Protein: 7 g/dL (ref 6.4–8.3)

## 2021-07-02 LAB — EKG 12-LEAD
P Axis: 50 degrees
P-R Interval: 132 ms
Q-T Interval: 427 ms
QRS Duration: 107 ms
QTc Calculation (Bazett): 459 ms
R Axis: 55 degrees
T Axis: 1 degrees
Ventricular Rate: 69 {beats}/min

## 2021-07-02 LAB — CBC
Hematocrit: 48.3 % (ref 38.0–52.0)
Hemoglobin: 16.5 g/dL (ref 13.0–17.3)
MCH: 30 pg (ref 27.0–34.5)
MCHC: 34.2 g/dL (ref 32.0–36.0)
MCV: 87.8 fL (ref 84.0–100.0)
MPV: 9.9 fL (ref 7.2–13.2)
Platelets: 200 10*3/uL (ref 140–440)
RBC: 5.5 x10e6/mcL (ref 4.00–5.60)
RDW: 11.9 % (ref 11.0–16.0)
WBC: 10.8 10*3/uL — ABNORMAL HIGH (ref 3.8–10.6)

## 2021-07-02 LAB — TROPONIN: Troponin T: <0.01 ng/mL (ref 0.000–0.010)

## 2021-07-02 NOTE — ED Provider Notes (Signed)
[facility] EM[facility] DEPT  EMERGENCY DEPARTMENT ENCOUNTER      Pt Name: Adrian Miles  MRN: [id number]  Bureau 1961/04/20  Date of evaluation: 07/02/2021  Provider: Benay Pillow, MD    [place]       Chief Complaint   Patient presents with    Chest Pain     Pt has had burning to chest radiating to back and left arm x 2 days. Pt was seen by pcp pta and sent here for concerning ekg.         HISTORY OF PRESENT ILLNESS    Adrian Miles is a 61 y.o. male who presents to the emergency department    Patient has a history of hypertension and spinal stenosis presenting with complaints of chest pain.  He notes that he woke up 2 days ago with bilateral arm and back pain but also some chest pain.  He states he is experienced this in the past with spinal stenosis but this time it is been more persistent.  He notes it is migratory along various parts of the left and right chest but more persistent on the left side.  He has had 2 stress test in life for the last 1 normal 3 to 4 years ago.  He was primary care doctor yesterday who obtained an EKG that had some abnormalities along with his persistent pain and the pain patient comes in for evaluation.  He does travel extensively for sales but no hemoptysis or recent leg swelling.  No abdominal symptoms reported.  No shortness of breath or cough.  He states the pain is actually of pretty alleviated by now.  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  He typically has pain radiating to the left and right fourth and fifth fingers.  No prior cardiac history            Nursing Notes were reviewed.    REVIEW OF SYSTEMS       Review of Systems   All other systems reviewed and are negative.    Except as noted above the remainder of the review of systems was reviewed and negative.       PAST MEDICAL HISTORY     Past Medical History:   Diagnosis Date    Hypertension     Spinal stenosis        SURGICAL HISTORY     History  reviewed. No pertinent surgical history.    CURRENT MEDICATIONS       Previous Medications    AMLODIPINE (NORVASC) 5 MG TABLET    amlodipine 5 mg tablet    CYCLOBENZAPRINE (FLEXERIL) 5 MG TABLET    cyclobenzaprine 5 mg tablet    GABAPENTIN (NEURONTIN) 300 MG CAPSULE    300 mg.    HYDROCODONE-ACETAMINOPHEN (NORCO) 5-325 MG PER TABLET    hydrocodone 5 mg-acetaminophen 325 mg tablet   take 1 to 2 tablets by mouth every 4 hours AS NEEDED       ALLERGIES     Patient has no known allergies.    FAMILY HISTORY     No family history on file.       SOCIAL HISTORY       Social History     Socioeconomic History    Marital status: Married     Spouse name: None    Number of children: None    Years of education: None    Highest education  level: None   Tobacco Use    Smoking status: Never    Smokeless tobacco: Never           PHYSICAL EXAM       ED Triage Vitals   BP Temp Temp Source Heart Rate Resp SpO2 Height Weight   07/02/21 1644 07/02/21 1644 07/02/21 1644 07/02/21 1643 07/02/21 1643 07/02/21 1643 07/02/21 1642 07/02/21 1642   (!) 189/104 98.2 ??F (36.8 ??C) Oral 74 18 96 % 6' (1.829 m) 250 lb (113.4 kg)       Physical Exam  Vitals and nursing note reviewed.   Constitutional:       Appearance: Normal appearance.   HENT:      Head: Normocephalic and atraumatic.      Nose: Nose normal.      Mouth/Throat:      Mouth: Mucous membranes are moist.   Eyes:      Extraocular Movements: Extraocular movements intact.      Conjunctiva/sclera: Conjunctivae normal.   Cardiovascular:      Rate and Rhythm: Normal rate and regular rhythm.      Heart sounds: Normal heart sounds.      Comments: 2+ bilateral radial and right DP pulse.  No peripheral edema  Pulmonary:      Effort: Pulmonary effort is normal.      Breath sounds: Normal breath sounds.   Abdominal:      General: Abdomen is flat. Bowel sounds are normal. There is no distension.      Palpations: Abdomen is soft. There is no mass.      Tenderness: There is no abdominal tenderness. There  is no guarding.   Musculoskeletal:         General: Normal range of motion.      Comments: Patient is tender along the bilateral's paraspinous lower thoracic musculature left greater than right   Skin:     General: Skin is warm and dry.   Neurological:      General: No focal deficit present.      Mental Status: He is alert.      Sensory: Sensation is intact.      Motor: Motor function is intact.   Psychiatric:         Mood and Affect: Mood normal.       DIAGNOSTIC RESULTS     EKG: All EKG's are interpreted by the Emergency Department Physician who either signs or Co-signs this chart in the absence of a cardiologist.      RADIOLOGY:   Non-plain film images such as CT, Ultrasound and MRI are read by the radiologist. Plain radiographic images are visualized and preliminarily interpreted by the emergency physician with the below findings:    Interpretation per the Radiologist below, if available at the time of this note:    XR CHEST PORTABLE   Final Result   Negative AP chest.            ED BEDSIDE ULTRASOUND:   Performed by ED Physician - none    LABS:  Results for orders placed or performed during the hospital encounter of 07/02/21   CBC   Result Value Ref Range    WBC 10.8 (H) 3.8 - 10.6 x10e3/mcL    RBC 5.50 4.00 - 5.60 x10e6/mcL    Hemoglobin 16.5 13.0 - 17.3 g/dL    Hematocrit 48.3 38.0 - 52.0 %    MCV 87.8 84.0 - 100.0 fL    MCH 30.0 27.0 - 34.5 pg  MCHC 34.2 32.0 - 36.0 g/dL    RDW 11.9 11.0 - 16.0 %    Platelets 200 140 - 440 x10e3/mcL    MPV 9.9 7.2 - 13.2 fL   Comprehensive Metabolic Panel   Result Value Ref Range    Sodium 138 135 - 145 mmol/L    Potassium 3.9 3.5 - 5.3 mmol/L    Chloride 104 98 - 107 mmol/L    CO2 24 22 - 29 mmol/L    Glucose 105 (H) 70 - 99 mg/dL    BUN 17 6 - 20 mg/dL    Creatinine 0.9 0.7 - 1.3 mg/dL    Anion Gap 10 2 - 17 mmol/L    OSMOLALITY CALCULATED 277 270 - 287 mOsm/kg    Calcium 9.1 8.6 - 10.0 mg/dL    Total Protein 7.0 6.4 - 8.3 g/dL    Albumin 4.2 3.5 - 5.2 g/dL    Globulin  2.9 1.9 - 4.4 g/dL    Albumin/Globulin Ratio 1.00 1.00 - 2.70    Total Bilirubin 1.20 0.00 - 1.20 mg/dL    Alk Phosphatase 119 40 - 130 unit/L    AST 20 0 - 50 unit/L    ALT 25 0 - 50 unit/L    Est, Glom Filt Rate 98 >=90 mL/min/1.40m?   Troponin   Result Value Ref Range    Troponin T <0.010 0.000 - 0.010 ng/mL   Troponin   Result Value Ref Range    Troponin T <0.010 0.000 - 0.010 ng/mL   D-Dimer, Quantitative   Result Value Ref Range    D-Dimer, Quant <=0.27 0.19 - 0.51 [id number]  EKG 12 Lead   Result Value Ref Range    Ventricular Rate 69 BPM    P-R Interval 132 ms    QRS Duration 107 ms    Q-T Interval 427 ms    QTc Calculation (Bazett) 459 ms    P Axis 50 degrees    R Axis 55 degrees    T Axis 1 degrees    Diagnosis       SINUS RHYTHM  INFERIOR MYOCARDIAL INFARCTION , OF INDETERMINATE AGE [40+ ms Q WAVE AND/OR   ST/T ABNORMALITY IN II/aVF]  ABNORMAL ECG  Reviewed by _________________    Confirmed by WSusa Griffins Scott ([id number]) on 07/02/2021 5:08:59 PM          EMERGENCY DEPARTMENT COURSE and DIFFERENTIAL DIAGNOSIS/MDM:   Vitals:    Vitals:    07/02/21 1644 07/02/21 1822 07/02/21 1912 07/02/21 1915   BP: (!) 189/104 (!) 191/116 (!) 172/101    Pulse:  73 67 64   Resp:  _0 Temp: 98.2 ??F (36.8 ??C)      TempSrc: Oral      SpO2:  96% 95% 96%   Weight:       Height:           MDM  Number of Diagnoses or Management Options  Atypical chest pain  Diagnosis management comments: Reviewed the EKG from his primary care office that is similar to the one today.  There are some Q waves in lead III and aVF.  His chest pain is essentially resolved by now.  He is neurovascular intact.  Chest pain itself is atypical.  His heart score is 3.  Consider low risk for PE.    D-dimer negative.  Serial troponins negative.  Chest x-ray negative.  EKG nonspecific.  He asked describes that he feels better when he  works out he does not have any exertional symptoms.  The chest pain is reproducible.  I have also considered thoracic  radiculopathy contributing to his pain.  Recommend outpatient cardiology work-up which his primary care doctor is already started that process with strict return precautions advised.  He also has Ortho follow-up regarding his back        SCREENINGS         Glasgow Coma Scale  Eye Opening: Spontaneous  Best Verbal Response: Oriented  Best Motor Response: Obeys commands  Glasgow Coma Scale Score: 15                     CIWA Assessment  BP: (!) 172/101  Heart Rate: 81                 REASSESSMENT     ED Course as of 07/02/21 2018   Fri Jul 02, 2021   2005 Troponin T: <0.010 [JC]   2016 Patient is doing well.  On repeat examination he has reproducible tenderness on the left lower sternal border no crepitus looks well he is feeling well.  His back pain feels better after the Toradol [JC]      ED Course User Index  [JC] Benay Pillow, MD         CONSULTS:  None    PROCEDURES:  Unless otherwise noted below, none     Procedures      FINAL IMPRESSION      1. Atypical chest pain          DISPOSITION/PLAN   DISPOSITION Decision To Discharge 07/02/2021 08:17:23 PM      PATIENT REFERRED TO:  [facility]  Address: [place], [place] [address]  Phone: [phone]  Call in 2 days  For further evaluation of your condition, outpatient cardiac workup as indicated    Lucio Edward  [place] [address]  [phone]    In 3 days  regarding your spinal issues    DISCHARGE MEDICATIONS:  New Prescriptions    No medications on file     Controlled Substances Monitoring:     No flowsheet data found.    (Please note that portions of this note were completed with a voice recognition program.  Efforts were made to edit the dictations but occasionally words are mis-transcribed.)    Benay Pillow, MD (electronically signed)  Attending Emergency Physician            Benay Pillow, MD  07/02/21 2018

## 2021-07-03 LAB — TROPONIN: Troponin T: <0.01 ng/mL (ref 0.000–0.010)

## 2021-07-03 LAB — D-DIMER, QUANTITATIVE: D-Dimer, Quant: <=0.27 CD:295178345 (ref 0.19–0.51)

## 2021-07-03 MED ORDER — KETOROLAC TROMETHAMINE 15 MG/ML IJ SOLN
15 MG/ML | Freq: Once | INTRAMUSCULAR | Status: AC
Start: 2021-07-03 — End: 2021-07-02
  Administered 2021-07-03: 01:00:00 15 mg via INTRAVENOUS

## 2021-07-03 MED FILL — KETOROLAC TROMETHAMINE 15 MG/ML IJ SOLN: 15 MG/ML | INTRAMUSCULAR | Qty: 1

## 2021-07-22 ENCOUNTER — Inpatient Hospital Stay
Admit: 2021-07-22 | Discharge: 2021-07-22 | Disposition: A | Discharge status: Home / Self Care | Payer: BLUE CROSS/BLUE SHIELD

## 2021-07-22 DIAGNOSIS — M79602 Pain in left arm: Secondary | ICD-10-CM

## 2021-07-22 LAB — EKG 12-LEAD
P Axis: 52 degrees
P Axis: 52 degrees
P-R Interval: 169 ms
P-R Interval: 169 ms
Q-T Interval: 406 ms
Q-T Interval: 406 ms
QRS Duration: 116 ms
QRS Duration: 116 ms
QTc Calculation (Bazett): 410 ms
QTc Calculation (Bazett): 410 ms
R Axis: -1 degrees
R Axis: -1 degrees
T Axis: 31 degrees
T Axis: 31 degrees
Ventricular Rate: 61 {beats}/min
Ventricular Rate: 61 {beats}/min

## 2022-10-17 ENCOUNTER — Emergency Department: Admit: 2022-10-17 | Payer: BLUE CROSS/BLUE SHIELD

## 2022-10-17 ENCOUNTER — Inpatient Hospital Stay
Admit: 2022-10-17 | Discharge: 2022-10-17 | Disposition: A | Discharge status: Home / Self Care | Payer: BLUE CROSS/BLUE SHIELD | Attending: Emergency Medicine

## 2022-10-17 DIAGNOSIS — K219 Gastro-esophageal reflux disease without esophagitis: Secondary | ICD-10-CM

## 2022-10-17 LAB — CBC WITH AUTO DIFFERENTIAL
Basophils %: 0.3 % (ref 0.0–2.0)
Basophils Absolute: 0 10*3/uL (ref 0.0–0.2)
Eosinophils %: 0.6 % (ref 0.0–7.0)
Eosinophils Absolute: 0.1 10*3/uL (ref 0.0–0.5)
Hematocrit: 40.5 % (ref 38.0–52.0)
Hemoglobin: 13.4 g/dL (ref 13.0–17.3)
Immature Grans (Abs): 0.01 10*3/uL (ref 0.00–0.06)
Immature Granulocytes %: 0.1 % (ref 0.0–0.6)
Lymphocytes Absolute: 2.2 10*3/uL (ref 1.0–3.2)
Lymphocytes: 24.1 % (ref 15.0–45.0)
MCH: 28.5 pg (ref 27.0–34.5)
MCHC: 33.1 g/dL (ref 32.0–36.0)
MCV: 86 fL (ref 84.0–100.0)
MPV: 10.3 fL (ref 7.2–13.2)
Monocytes %: 9.2 % (ref 4.0–12.0)
Monocytes Absolute: 0.9 10*3/uL (ref 0.3–1.0)
Neutrophils %: 65.7 % (ref 42.0–74.0)
Neutrophils Absolute: 6.1 10*3/uL (ref 1.6–7.3)
Platelets: 200 10*3/uL (ref 140–440)
RBC: 4.71 x10e6/mcL (ref 4.00–5.60)
RDW: 11.8 % (ref 11.0–16.0)
WBC: 9.3 10*3/uL (ref 3.8–10.6)

## 2022-10-17 LAB — EKG 12-LEAD
P Axis: 46 degrees
P-R Interval: 161 ms
Q-T Interval: 393 ms
QRS Duration: 106 ms
QTc Calculation (Bazett): 426 ms
R Axis: 1 degrees
T Axis: 25 degrees
Ventricular Rate: 70 {beats}/min

## 2022-10-17 LAB — BASIC METABOLIC PANEL
Anion Gap: 11 mmol/L (ref 2–17)
BUN: 15 mg/dL (ref 8–23)
CO2: 24 mmol/L (ref 22–29)
Calcium: 9.2 mg/dL (ref 8.5–10.7)
Chloride: 104 mmol/L (ref 98–107)
Creatinine: 1.1 mg/dL (ref 0.7–1.3)
Est, Glom Filt Rate: 76 mL/min/1.73m (ref >=60)
Glucose: 177 mg/dL — ABNORMAL HIGH (ref 70–99)
Osmolaliy Calculated: 283 mOsm/kg (ref 270–287)
Potassium: 3.7 mmol/L (ref 3.5–5.3)
Sodium: 139 mmol/L (ref 135–145)

## 2022-10-17 LAB — TROPONIN: Troponin, High Sensitivity: 7 ng/L (ref 0–22)

## 2022-10-17 MED ORDER — PREDNISONE 20 MG PO TABS
20 | Freq: Once | ORAL | Status: AC
Start: 2022-10-17 — End: 2022-10-17
  Administered 2022-10-17: 08:00:00 40 mg via ORAL

## 2022-10-17 MED ORDER — PREDNISONE 20 MG PO TABS
20 | ORAL_TABLET | Freq: Every day | ORAL | 0 refills | Status: AC
Start: 2022-10-17 — End: 2022-10-22

## 2022-10-17 MED ORDER — MORPHINE SULFATE (PF) 4 MG/ML IJ SOLN
4 | INTRAMUSCULAR | Status: DC
Start: 2022-10-17 — End: 2022-10-17

## 2022-10-17 MED ORDER — ONDANSETRON HCL 4 MG/2ML IJ SOLN
4 | Freq: Once | INTRAMUSCULAR | Status: DC
Start: 2022-10-17 — End: 2022-10-17

## 2022-10-17 MED ORDER — OMEPRAZOLE 20 MG PO CPDR
20 | ORAL_CAPSULE | Freq: Every day | ORAL | 0 refills | Status: AC
Start: 2022-10-17 — End: 2022-11-16

## 2022-10-17 MED ORDER — ALUM & MAG HYDROXIDE-SIMETH 200-200-20 MG/5ML PO SUSP
200-200-20 | Freq: Once | ORAL | Status: AC
Start: 2022-10-17 — End: 2022-10-17
  Administered 2022-10-17: 07:00:00 30 mL via ORAL

## 2022-10-17 MED ORDER — LIDOCAINE VISCOUS HCL 2 % MT SOLN
2 | Freq: Once | OROMUCOSAL | Status: AC
Start: 2022-10-17 — End: 2022-10-17
  Administered 2022-10-17: 07:00:00 15 mL via OROMUCOSAL

## 2022-10-17 MED FILL — ONDANSETRON HCL 4 MG/2ML IJ SOLN: 4 MG/2ML | INTRAMUSCULAR | Qty: 2

## 2022-10-17 MED FILL — MORPHINE SULFATE (PF) 4 MG/ML IJ SOLN: 4 MG/ML | INTRAMUSCULAR | Qty: 1

## 2022-10-17 MED FILL — PREDNISONE 20 MG PO TABS: 20 MG | ORAL | Qty: 2

## 2022-10-17 MED FILL — MAG-AL PLUS 200-200-20 MG/5ML PO LIQD: 200-200-20 MG/5ML | ORAL | Qty: 30

## 2022-10-17 MED FILL — LIDOCAINE VISCOUS HCL 2 % MT SOLN: 2 % | OROMUCOSAL | Qty: 15

## 2022-10-17 NOTE — ED Provider Notes (Signed)
[facility] DEPT  EMERGENCY DEPARTMENT ENCOUNTER      Pt Name: Adrian Miles  MRN: [id number]  Birthdate [birth year]  Date of evaluation: 10/17/2022  Provider: Kristeen Mans, MD    CHIEF COMPLAINT       Chief Complaint   Patient presents with    Chest Pain     Substernal chest pain radiating into bilateral upper chest. Onset approx 6-7 hours at rest. Endorses shortness of breath. Alert, oriented. Skin tone and resp WNL. Speaks in full sentences without difficulty          HISTORY OF PRESENT ILLNESS    HPI  62 y.o. male presents today with 6 to 7 hours of chest pain.  Patient has a history of stenosis in the cervical and thoracic area and does typically get chest pain but this seems to have lasted longer.  Patient denies fevers or chills or nausea vomiting or shortness of breath.  Is no worse with breathing and there is no pleuritic component.  He has no calf swelling or tenderness.  He had chest pain workups in the past that were negative most recently had nuclear stress test and echo within the last year.  He has no risk factors including no diabetes, smoking, family history, drug use, cardiac disease, hypertension.  Has been told that his cholesterol lipids are excellent.  Patient does have reflux.  And states specifically that he and his wife have learned that they cannot eat pizza but, actually ate pizza tonight.      Nursing Notes were reviewed.    REVIEW OF SYSTEMS     Review of Systems   Constitutional:  Negative for activity change, chills and fever.   Respiratory:  Negative for shortness of breath.    Cardiovascular:  Negative for palpitations and leg swelling.   Gastrointestinal:  Negative for abdominal pain.   Genitourinary:  Negative for flank pain.   Musculoskeletal:  Negative for neck pain.   Skin:  Negative for color change, pallor, rash and wound.   Neurological:  Negative for weakness and numbness.        PAST MEDICAL HISTORY     Past Medical History:   Diagnosis Date    Hypertension      Spinal stenosis        SURGICAL HISTORY     No past surgical history on file.    CURRENT MEDICATIONS       Previous Medications    AMLODIPINE (NORVASC) 5 MG TABLET    amlodipine 5 mg tablet    CYCLOBENZAPRINE (FLEXERIL) 5 MG TABLET    cyclobenzaprine 5 mg tablet    GABAPENTIN (NEURONTIN) 300 MG CAPSULE    300 mg.    HYDROCODONE-ACETAMINOPHEN (NORCO) 5-325 MG PER TABLET    hydrocodone 5 mg-acetaminophen 325 mg tablet   take 1 to 2 tablets by mouth every 4 hours AS NEEDED       ALLERGIES     Patient has no known allergies.    FAMILY HISTORY     No family history on file.     SOCIAL HISTORY       Social History     Socioeconomic History    Marital status: Married   Tobacco Use    Smoking status: Never    Smokeless tobacco: Never       SCREENINGS         Glasgow Coma Scale  Eye Opening: Spontaneous  Best Verbal Response: Oriented  Best Motor Response: Obeys commands  Glasgow Coma Scale Score: 15                     CIWA Assessment  BP: (!) 154/96  Pulse: 66                 PHYSICAL EXAM    (up to 7 for level 4, 8 or more for level 5)     ED Triage Vitals [10/17/22 0250]   BP Temp Temp Source Pulse Respirations SpO2 Height Weight - Scale   (!) 191/117 98.1 F (36.7 C) Oral 71 18 95 % 1.854 m (6\' 1" ) 116.2 kg (256 lb 2.8 oz)       Physical Exam  Vitals and nursing note reviewed.   Constitutional:       General: He is not in acute distress.     Appearance: Normal appearance. He is not ill-appearing.      Comments: Vitals are unremarkable other than some hypertension   HENT:      Head: Normocephalic and atraumatic.      Right Ear: External ear normal.      Left Ear: External ear normal.      Nose: Nose normal.      Mouth/Throat:      Mouth: Mucous membranes are moist.      Pharynx: No oropharyngeal exudate or posterior oropharyngeal erythema.   Eyes:      Extraocular Movements: Extraocular movements intact.      Conjunctiva/sclera: Conjunctivae normal.   Neck:      Vascular: No carotid bruit.   Cardiovascular:      Rate  and Rhythm: Normal rate and regular rhythm.      Pulses: Normal pulses.           Dorsalis pedis pulses are 2+ on the right side and 2+ on the left side.   Pulmonary:      Effort: Pulmonary effort is normal. No respiratory distress.      Breath sounds: Normal breath sounds.   Abdominal:      General: Abdomen is flat.      Palpations: Abdomen is soft. There is no mass.      Tenderness: There is no abdominal tenderness. There is no right CVA tenderness, left CVA tenderness or guarding.   Musculoskeletal:         General: No swelling, tenderness or deformity. Normal range of motion.      Cervical back: Normal range of motion. No rigidity or tenderness.      Right lower leg: No edema.      Left lower leg: No edema.   Skin:     Capillary Refill: Capillary refill takes 2 to 3 seconds.      Findings: No erythema or rash.   Neurological:      General: No focal deficit present.      Mental Status: He is alert and oriented to person, place, and time.      Gait: Gait normal.   Psychiatric:         Behavior: Behavior normal.         DIAGNOSTIC RESULTS   PROCEDURES:  Unless otherwise noted below, none     Procedures    EKG: All EKG's are interpreted by the Emergency Department Physician who either signs or Co-signs this chart in the absence of a cardiologist.      RADIOLOGY:   Non-plain film images such as CT, Ultrasound and MRI are read by the radiologist. Plain radiographic images  are visualized and preliminarily interpreted by the emergency physician with the below findings:    Interpretation per the Radiologist below, if available at the time of this note:    XR CHEST PORTABLE    (Results Pending)       ED BEDSIDE ULTRASOUND:   Performed by ED Physician - none    LABS:  Labs Reviewed   BASIC METABOLIC PANEL - Abnormal; Notable for the following components:       Result Value    Glucose 177 (*)     All other components within normal limits   TROPONIN   CBC WITH AUTO DIFFERENTIAL       All other labs were within normal range  or not returned as of this dictation.    EMERGENCY DEPARTMENT COURSE/REASSESSMENT and MDM:   Vitals:    Vitals:    10/17/22 0250 10/17/22 0307 10/17/22 0330 10/17/22 0400   BP: (!) 191/117 (!) 163/86 138/74 (!) 154/96   Pulse: 71 67 70 66   Resp: 18 20 19 20    Temp: 98.1 F (36.7 C)      TempSrc: Oral      SpO2: 95% 96% 95% 94%   Weight: 116.2 kg (256 lb 2.8 oz)      Height: 1.854 m (6\' 1" )          ED Course:    ED Course as of 10/17/22 0439   Mon Oct 17, 2022   0253 EKG is a sinus rhythm and normal EKG.  There is no ectopy and there is no ischemia, read by me. [JW]   0330 XR CHEST PORTABLE  Chest x-ray, read by me.  Normal, no intrathoracic abnormalities, specifically no infiltrates, no effusions, no pneumothorax. [JW]   [id number] Patient had relief or some relief with GI cocktail.  He still has some pain.  He expresses that he had real problems with the spinal stenosis and has been radiating around to his shoulders and chest that is resulted in cardiac workups in the past.  I have no suspicion this is cardiac etiology.  He had it for over 6 hours.  And his cardiac workup is negative including no risk factors and relatively recent nuke stress test that was negative.  I do suspect some GI reflux or esophageal spasm given that he has a history of reflux and has been much worse with eating things like pizza any specifically mention pizza but, happened to have pizza the night before this started.  And GI cocktail did give him some relief but he still seems to have some pain.  Will get a give him morphine given that this could be a spinal stenosis. [JW]   0411 The pain is not pleuritic.  He is not tachycardic, he is not tachypneic.  He is not hypoxic.  There is no indication this is related to a PE or any vascular catastrophe. [JW]   [id number] Patient is feeling patient is feeling better and would like to leave.  He got some steroid I will put him on steroid for couple days but also put him on Protonix for couple days.  He is  can follow-up with his doctor in the next day or so. [JW]      ED Course User Index  [JW] Kristeen Mans, MD       Medical Decision Making  Amount and/or Complexity of Data Reviewed  Labs: ordered.  Radiology: ordered. Decision-making details documented in ED Course.    Risk  OTC drugs.  Prescription drug management.       CONSULTS:  None    FINAL IMPRESSION      1. Gastroesophageal reflux disease, unspecified whether esophagitis present    2. Spinal stenosis of cervicothoracic region          DISPOSITION/PLAN   DISPOSITION Decision To Discharge 10/17/2022 04:37:42 AM      PATIENT REFERRED TO:  [facility]  [address]  [address]  [place] [address]  [phone]    In 1 day      [facility] EMERGENCY DEPT  [address]  [place] [address]  [phone]    As needed, If symptoms worsen      DISCHARGE MEDICATIONS:  New Prescriptions    OMEPRAZOLE (PRILOSEC) 20 MG DELAYED RELEASE CAPSULE    Take 2 capsules by mouth Daily    PREDNISONE (DELTASONE) 20 MG TABLET    Take 2 tablets by mouth daily for 4 days     Controlled Substances Monitoring:          No data to display                (Please note that portions of this note were completed with a voice recognition program.  Efforts were made to edit the dictations but occasionally words are mis-transcribed.)    Kristeen Mans, MD (electronically signed)  Attending Emergency Physician          Kristeen Mans, MD  10/17/22 [phone]
